# Patient Record
Sex: Male | Born: 1955
Health system: Southern US, Community
[De-identification: ages and names within clinical notes are randomized; demographics above are authoritative.]

## PROBLEM LIST (undated history)

## (undated) DIAGNOSIS — G4733 Obstructive sleep apnea (adult) (pediatric): Secondary | ICD-10-CM

## (undated) DIAGNOSIS — C159 Malignant neoplasm of esophagus, unspecified: Secondary | ICD-10-CM

## (undated) DIAGNOSIS — I10 Essential (primary) hypertension: Secondary | ICD-10-CM

## (undated) DIAGNOSIS — J45909 Unspecified asthma, uncomplicated: Secondary | ICD-10-CM

## (undated) DIAGNOSIS — I2699 Other pulmonary embolism without acute cor pulmonale: Secondary | ICD-10-CM

## (undated) DIAGNOSIS — J449 Chronic obstructive pulmonary disease, unspecified: Secondary | ICD-10-CM

## (undated) DIAGNOSIS — Z9889 Other specified postprocedural states: Secondary | ICD-10-CM

## (undated) DIAGNOSIS — R918 Other nonspecific abnormal finding of lung field: Secondary | ICD-10-CM

## (undated) HISTORY — DX: Chronic obstructive pulmonary disease, unspecified: J44.9

## (undated) HISTORY — DX: Other pulmonary embolism without acute cor pulmonale: I26.99

## (undated) HISTORY — DX: Obstructive sleep apnea (adult) (pediatric): G47.33

## (undated) HISTORY — PX: CERVICAL SPINE SURGERY: SHX589

## (undated) HISTORY — PX: TONSILLECTOMY: SUR1361

## (undated) HISTORY — DX: Malignant neoplasm of esophagus, unspecified: C15.9

## (undated) HISTORY — DX: Other nonspecific abnormal finding of lung field: R91.8

---

## 2011-02-28 HISTORY — PX: OTHER SURGICAL HISTORY: SHX169

## 2011-07-21 ENCOUNTER — Other Ambulatory Visit (HOSPITAL_COMMUNITY): Payer: Self-pay | Admitting: Oncology

## 2011-07-21 DIAGNOSIS — C155 Malignant neoplasm of lower third of esophagus: Secondary | ICD-10-CM

## 2011-07-27 ENCOUNTER — Encounter (HOSPITAL_COMMUNITY)
Admission: RE | Admit: 2011-07-27 | Discharge: 2011-07-27 | Disposition: A | Discharge status: Home / Self Care | Payer: BC Managed Care – PPO | Source: Ambulatory Visit | Attending: Oncology | Admitting: Oncology

## 2011-07-27 ENCOUNTER — Encounter (HOSPITAL_COMMUNITY): Payer: Self-pay

## 2011-07-27 DIAGNOSIS — C155 Malignant neoplasm of lower third of esophagus: Secondary | ICD-10-CM | POA: Insufficient documentation

## 2011-07-27 LAB — GLUCOSE, CAPILLARY: Glucose-Capillary: 94 mg/dL (ref 70–99)

## 2011-07-27 MED ORDER — FLUDEOXYGLUCOSE F - 18 (FDG) INJECTION
19.5000 | Freq: Once | INTRAVENOUS | Status: AC | PRN
  Administered 2011-07-27: 19.5 via INTRAVENOUS

## 2011-07-28 ENCOUNTER — Other Ambulatory Visit (HOSPITAL_COMMUNITY): Payer: Self-pay

## 2011-07-31 ENCOUNTER — Other Ambulatory Visit: Payer: Self-pay

## 2011-07-31 ENCOUNTER — Telehealth: Payer: Self-pay | Admitting: *Deleted

## 2011-07-31 ENCOUNTER — Telehealth: Payer: Self-pay

## 2011-07-31 DIAGNOSIS — C159 Malignant neoplasm of esophagus, unspecified: Secondary | ICD-10-CM

## 2011-07-31 NOTE — Telephone Encounter (Signed)
Received phone call from Dr. Dennie Maizes office and spoke with [facility].  Patient needs referral to Dr. Christella Hartigan for endoscopic ultrasound and is referred for GI conference.  This RN sent information from [facility] and Dr. Silvana Newness office to Chales Abrahams at Dr. Larae Grooms office.  This RN spoke with patient and informed him he would be receiving calls from both Dr. Larae Grooms office and Dr. Dennie Maizes office for appointments.  This RN gave patient contact phone number for questions/concerns.  Patient states he would plan to receive any therapy (chemo or radiation) in [place] where he lives.

## 2011-07-31 NOTE — Telephone Encounter (Signed)
Message copied by Donata Duff on Mon Jul 31, 2011  1:00 PM ------      Message from: Rob Bunting P      Created: Mon Jul 31, 2011 11:49 AM       He needs upper EUS this Thursday, staging for newly diagnosed esophageal cancer (Dr. Charm Barges in [place]).  60 min, radial +/- linear, does not need propofol.            Thanks

## 2011-07-31 NOTE — Telephone Encounter (Signed)
Pt has been scheduled for EUS 08/03/11 Left message on machine to call back

## 2011-08-01 NOTE — Telephone Encounter (Signed)
Pt has been notified and meds reviewed he will call with any questions 

## 2011-08-03 ENCOUNTER — Encounter (HOSPITAL_COMMUNITY): Payer: Self-pay | Admitting: *Deleted

## 2011-08-03 ENCOUNTER — Encounter (HOSPITAL_COMMUNITY): Admission: RE | Disposition: A | Discharge status: Home / Self Care | Payer: Self-pay | Source: Ambulatory Visit

## 2011-08-03 ENCOUNTER — Ambulatory Visit (HOSPITAL_COMMUNITY)
Admission: RE | Admit: 2011-08-03 | Discharge: 2011-08-03 | Disposition: A | Discharge status: Home / Self Care | Payer: BC Managed Care – PPO | Source: Ambulatory Visit | Attending: Gastroenterology | Admitting: Gastroenterology

## 2011-08-03 DIAGNOSIS — C155 Malignant neoplasm of lower third of esophagus: Secondary | ICD-10-CM | POA: Insufficient documentation

## 2011-08-03 DIAGNOSIS — D001 Carcinoma in situ of esophagus: Secondary | ICD-10-CM

## 2011-08-03 DIAGNOSIS — F17200 Nicotine dependence, unspecified, uncomplicated: Secondary | ICD-10-CM | POA: Insufficient documentation

## 2011-08-03 DIAGNOSIS — C159 Malignant neoplasm of esophagus, unspecified: Secondary | ICD-10-CM

## 2011-08-03 DIAGNOSIS — I10 Essential (primary) hypertension: Secondary | ICD-10-CM | POA: Insufficient documentation

## 2011-08-03 HISTORY — DX: Carcinoma in situ of esophagus: D00.1

## 2011-08-03 HISTORY — PX: EUS: SHX5427

## 2011-08-03 HISTORY — DX: Other specified postprocedural states: Z98.890

## 2011-08-03 HISTORY — DX: Essential (primary) hypertension: I10

## 2011-08-03 HISTORY — DX: Unspecified asthma, uncomplicated: J45.909

## 2011-08-03 SURGERY — UPPER ENDOSCOPIC ULTRASOUND (EUS) LINEAR
Anesthesia: Moderate Sedation

## 2011-08-03 MED ORDER — FENTANYL CITRATE 0.05 MG/ML IJ SOLN
INTRAMUSCULAR | Status: AC
  Filled 2011-08-03: qty 2

## 2011-08-03 MED ORDER — MIDAZOLAM HCL 10 MG/2ML IJ SOLN
INTRAMUSCULAR | Status: AC
  Filled 2011-08-03: qty 2

## 2011-08-03 MED ORDER — SODIUM CHLORIDE 0.9 % IV SOLN
Freq: Once | INTRAVENOUS | Status: AC
  Administered 2011-08-03: 500 mL via INTRAVENOUS

## 2011-08-03 MED ORDER — FENTANYL CITRATE 0.05 MG/ML IJ SOLN
INTRAMUSCULAR | Status: DC | PRN
  Administered 2011-08-03 (×3): 25 ug via INTRAVENOUS

## 2011-08-03 MED ORDER — MIDAZOLAM HCL 10 MG/2ML IJ SOLN
INTRAMUSCULAR | Status: DC | PRN
  Administered 2011-08-03 (×4): 2 mg via INTRAVENOUS

## 2011-08-03 NOTE — Op Note (Signed)
[facility] [address] [place], [place]  [address]  ENDOSCOPIC ULTRASOUND PROCEDURE REPORT  PATIENT:  [name], [name]  MR#:  [id number] BIRTHDATE:  19-May-1955  GENDER:  male ENDOSCOPIST:  Rachael Fee, MD REFERRED BY:  Sheliah Plane, M.D. PROCEDURE DATE:  08/03/2011 PROCEDURE:  Upper EUS ASA CLASS:  Class III INDICATIONS:  recently diagnosed esophageal adenocarcinoma (Dr. Charm Barges), here for staging EUS MEDICATIONS:   Fentanyl 75 mcg IV, Versed 8 mg IV  DESCRIPTION OF PROCEDURE:   After the risks benefits and alternatives of the procedure were  explained, informed consent was obtained. The patient was then placed in the left, lateral, decubitus postion and IV sedation was administered. Throughout the procedure, the patient's blood pressure, pulse and oxygen saturations were monitored continuously.  Under direct visualization, the 110036 endoscope was introduced through the mouth and advanced to the second portion of the duodenum.  Water was used as necessary to provide an acoustic interface.  Upon completion of the imaging, water was removed and the patient was sent to the recovery room in satisfactory condition. <<PROCEDUREIMAGES>>  Endoscopic findings: 1. Circumferential, nearly obstructing,  clearly malignant mass in distal esophagus.  Proximal edge at 45cm from incisors, distal edge 50cm from incisors (5cm long tumor).  The GE junction was obliterated by tumor but probably lays at 48cm from incisors (there is tumor in proximal 1-2cm of stomach). 2. Otherwise normal stomach, duodenum  EUS findings: 1. The mass above corresponds with a hypoechoic, irregular, 1.2cm thick mass at GE junction that clearly passes into and through the muscularis propria layer of the esophageal wall (uT3). 2. There were two large, likely malignant lymphnodes at gastrohepatic ligament (2.2cm across). 3. There were 5 smaller, round, suspicious  paraesophageal lymphnodes.  Impression: 5cm long, circumferential, near obstructing uT3N1b eGE junction adenocarcinoma with proxima edge at 45cm from insisors.  ______________________________ Rachael Fee, MD  cc: Dairl Ponder, MD; Webb Silversmith, MD  n. eSIGNED:   Rachael Fee at 08/03/2011 04:37 PM  Leeann Must, [phone]

## 2011-08-03 NOTE — H&P (Signed)
  HPI: This is a man recently found to have esophageal adenocarcinoma by Dr. Charm Barges.  Recent PET suggested malignant adenopathy as well    Past Medical History  Diagnosis Date  . Hx of cervical spine surgery   . Hypertension   . Asthma     hx of asthma as a child    Past Surgical History  Procedure Date  . Cervical spine surgery   . Tonsillectomy     No current facility-administered medications for this encounter.    Allergies as of 07/31/2011  . (No Known Allergies)    No family history on file.  History   Social History  . Marital Status: Married    Spouse Name: N/A    Number of Children: N/A  . Years of Education: N/A   Occupational History  . Not on file.   Social History Main Topics  . Smoking status: Current Everyday Smoker -- 1.0 packs/day    Types: Cigarettes  . Smokeless tobacco: Not on file  . Alcohol Use: No  . Drug Use: Yes    Special: Marijuana  . Sexually Active:    Other Topics Concern  . Not on file   Social History Narrative  . No narrative on file      Physical Exam: BP 120/81  Temp(Src) 98.1 F (36.7 C) (Oral)  Resp 15  Ht 6\' 2"  (1.88 m)  Wt 225 lb (102.059 kg)  BMI 28.89 kg/m2  SpO2 96% Constitutional: generally well-appearing Psychiatric: alert and oriented x3 Abdomen: soft, nontender, nondistended, no obvious ascites, no peritoneal signs, normal bowel sounds     Assessment and plan: 56 y.o. male with esophageal cancer, likely locally advanced  For EUS today for staging

## 2011-08-03 NOTE — Discharge Instructions (Signed)

## 2011-08-04 ENCOUNTER — Encounter (HOSPITAL_COMMUNITY): Payer: Self-pay | Admitting: Gastroenterology

## 2011-10-25 ENCOUNTER — Other Ambulatory Visit (HOSPITAL_COMMUNITY): Payer: Self-pay | Admitting: Oncology

## 2011-10-25 DIAGNOSIS — C159 Malignant neoplasm of esophagus, unspecified: Secondary | ICD-10-CM

## 2011-10-31 ENCOUNTER — Encounter (HOSPITAL_COMMUNITY)
Admission: RE | Admit: 2011-10-31 | Discharge: 2011-10-31 | Disposition: A | Discharge status: Home / Self Care | Payer: Medicaid Other | Source: Ambulatory Visit | Attending: Oncology | Admitting: Oncology

## 2011-10-31 ENCOUNTER — Other Ambulatory Visit (HOSPITAL_COMMUNITY): Payer: BC Managed Care – PPO

## 2011-10-31 DIAGNOSIS — R911 Solitary pulmonary nodule: Secondary | ICD-10-CM | POA: Insufficient documentation

## 2011-10-31 DIAGNOSIS — C159 Malignant neoplasm of esophagus, unspecified: Secondary | ICD-10-CM

## 2011-10-31 LAB — GLUCOSE, CAPILLARY: Glucose-Capillary: 114 mg/dL — ABNORMAL HIGH (ref 70–99)

## 2011-10-31 MED ORDER — FLUDEOXYGLUCOSE F - 18 (FDG) INJECTION
18.2000 | Freq: Once | INTRAVENOUS | Status: AC | PRN
  Administered 2011-10-31: 18.2 via INTRAVENOUS

## 2012-01-16 DIAGNOSIS — J449 Chronic obstructive pulmonary disease, unspecified: Secondary | ICD-10-CM | POA: Insufficient documentation

## 2012-01-16 DIAGNOSIS — I10 Essential (primary) hypertension: Secondary | ICD-10-CM | POA: Insufficient documentation

## 2012-01-17 DIAGNOSIS — D500 Iron deficiency anemia secondary to blood loss (chronic): Secondary | ICD-10-CM | POA: Insufficient documentation

## 2012-01-23 DIAGNOSIS — I2699 Other pulmonary embolism without acute cor pulmonale: Secondary | ICD-10-CM | POA: Insufficient documentation

## 2013-01-21 IMAGING — PT NM PET TUM IMG RESTAG (PS) SKULL BASE T - THIGH
6 series · 25 of 25 positions shown · non-contrast
Comparison: PET CT scan 07/27/2011

CLINICAL DATA: Subsequent treatment strategy for esophageal
carcinoma.. PET CT scan 07/27/2011

NUCLEAR MEDICINE PET SKULL BASE TO THIGH
Fasting Blood Glucose:  114
TECHNIQUE: 18.2 mCi F-18 FDG was injected intravenously. CT data
was obtained and used for attenuation correction and anatomic
localization only.  (This was not acquired as a diagnostic CT
examination.) Additional exam technical data entered on
technologist worksheet.

[Series 1: pet ac · axial · 3.3mm · 4.69mm/px · z∈[-921,-51]mm · 5 of 267 slices shown]
[im 1/267]
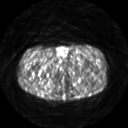
[im 67/267]
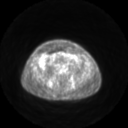
[im 134/267]
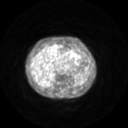
[im 200/267]
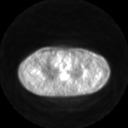
[im 267/267]
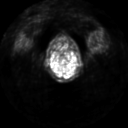

[Series 2: ct images · axial · 3.8mm · 0.98mm/px · z∈[-921,-51]mm · 5 of 264 slices shown]
[im 1/264]
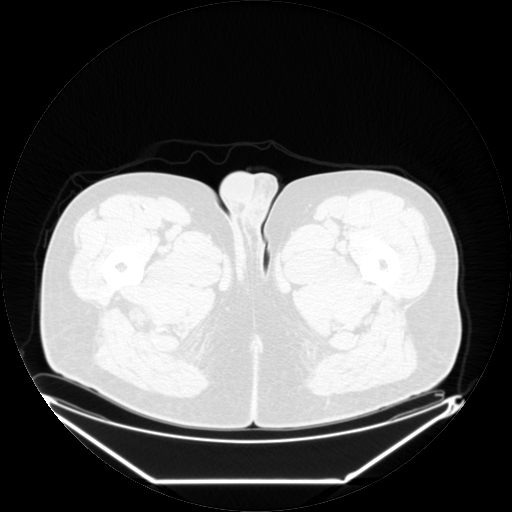
[im 66/264]
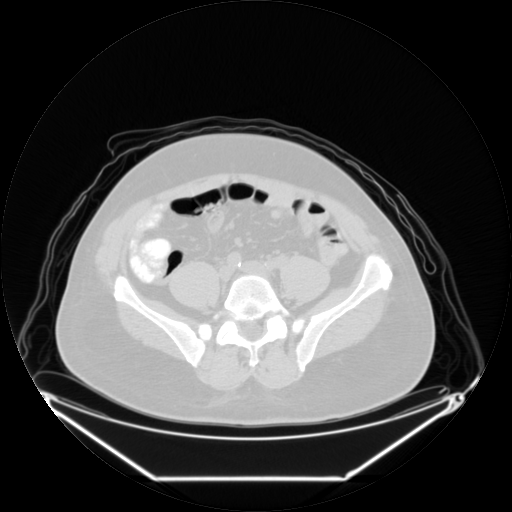
[im 132/264]
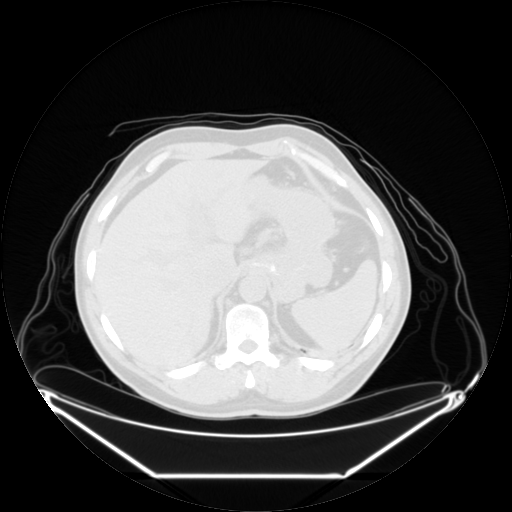
[im 198/264]
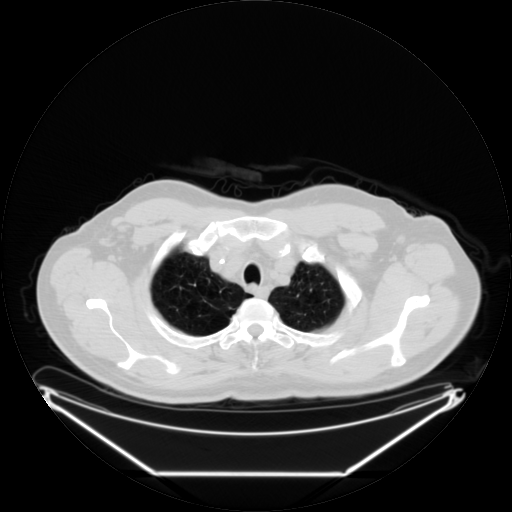
[im 264/264  brain]
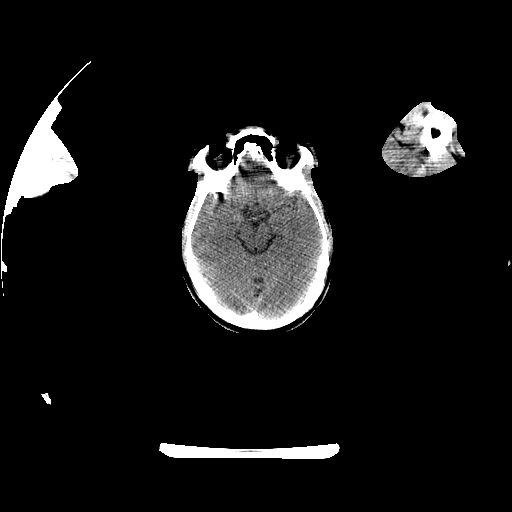

[Series 2: pet nac · axial · 3.3mm · 4.69mm/px · z∈[-921,-51]mm · 6 of 267 slices shown]
[im 1/267]
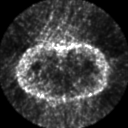
[im 54/267]
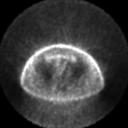
[im 107/267]
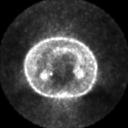
[im 160/267]
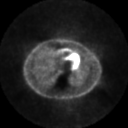
[im 213/267]
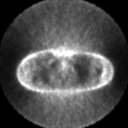
[im 267/267]
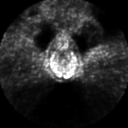

[Series 123: mip · coronal · 3.3mm · 4.69mm/px · 1 of 30 slices shown]
[im 1/30]
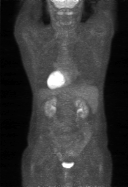

[Series 151: reformatted · axial · 3.3mm · 3.91mm/px · z∈[-921,-51]mm · 6 of 265 slices shown (1 of 2)]
[im 1/265]
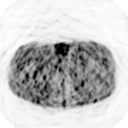
[im 53/265]
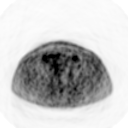
[im 106/265]
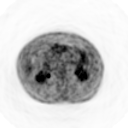
[im 159/265]
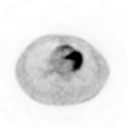
[im 212/265]
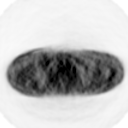
[im 265/265]
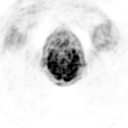

[Series 153: reformatted · coronal · 4.7mm · 6.98mm/px · 2 of 73 slices shown (2 of 2)]
[im 1/73]
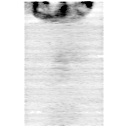
[im 73/73]
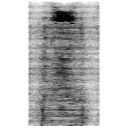

[25 of 25 positions shown; findings below may reference images not displayed]

FINDINGS: Neck: No hypermetabolic lymph nodes in the neck.

Chest:  Small 4 mm right upper lobe pulmonary nodule (image 82) is
not changed from prior.  No hypermetabolic mediastinal lymph nodes.

Abdomen/Pelvis:  Marked reduction in hypermetabolic activity
through the gas esophageal junction with no residual metabolic
activity above background activity.

Likewise the enlarged gastrohepatic ligament lymph nodes seen on
prior exam are reduced in size and have no residual hypermetabolic
activity above background.  Likewise periportal lymph nodes are
reduced in size and have no hypermetabolic activity above
background.  There is a low density lesion along the curvature of
the stomach measuring 3.2 cm which is unchanged from prior has no
hypermetabolic activity is simple fluid attenuation.  This is
unchanged from prior.  No abnormal metabolic activity in the liver.

No hypermetabolic pelvic lymph nodes.

There is a 20 mm high density cyst extending from the left kidney
which is unchanged as no metabolic active.

[place]:  No focal hypermetabolic activity to suggest skeletal
metastasis.  Low metabolic marrow activity through the upper lumbar
spine consistent with radiation port.
IMPRESSION: 1..  Complete resolution of hypermetabolic activity through the GE
junction consistent positive therapy response.

2.  No residual hypermetabolic activity within the  gastrohepatic
lymph nodes or periportal lymph nodes.
3.  No evidence of disease progression or distant metastasis.
4.  Stable small right pulmonary nodule is likely benign.

## 2013-04-10 HISTORY — PX: OTHER SURGICAL HISTORY: SHX169

## 2014-05-20 DIAGNOSIS — I2510 Atherosclerotic heart disease of native coronary artery without angina pectoris: Secondary | ICD-10-CM | POA: Diagnosis not present

## 2014-05-20 DIAGNOSIS — C159 Malignant neoplasm of esophagus, unspecified: Secondary | ICD-10-CM | POA: Diagnosis not present

## 2014-05-20 DIAGNOSIS — I10 Essential (primary) hypertension: Secondary | ICD-10-CM | POA: Diagnosis not present

## 2014-05-20 DIAGNOSIS — Z6821 Body mass index (BMI) 21.0-21.9, adult: Secondary | ICD-10-CM | POA: Diagnosis not present

## 2014-06-17 DIAGNOSIS — J9809 Other diseases of bronchus, not elsewhere classified: Secondary | ICD-10-CM | POA: Diagnosis not present

## 2014-06-17 DIAGNOSIS — C155 Malignant neoplasm of lower third of esophagus: Secondary | ICD-10-CM | POA: Diagnosis not present

## 2014-06-17 DIAGNOSIS — R911 Solitary pulmonary nodule: Secondary | ICD-10-CM | POA: Diagnosis not present

## 2014-06-17 DIAGNOSIS — Z9884 Bariatric surgery status: Secondary | ICD-10-CM | POA: Diagnosis not present

## 2014-06-17 DIAGNOSIS — J439 Emphysema, unspecified: Secondary | ICD-10-CM | POA: Diagnosis not present

## 2014-06-17 DIAGNOSIS — Z8501 Personal history of malignant neoplasm of esophagus: Secondary | ICD-10-CM | POA: Diagnosis not present

## 2014-06-18 DIAGNOSIS — Z7901 Long term (current) use of anticoagulants: Secondary | ICD-10-CM | POA: Diagnosis not present

## 2014-06-18 DIAGNOSIS — C155 Malignant neoplasm of lower third of esophagus: Secondary | ICD-10-CM | POA: Diagnosis not present

## 2014-06-18 DIAGNOSIS — I2699 Other pulmonary embolism without acute cor pulmonale: Secondary | ICD-10-CM | POA: Diagnosis not present

## 2014-06-30 DIAGNOSIS — Z6821 Body mass index (BMI) 21.0-21.9, adult: Secondary | ICD-10-CM | POA: Diagnosis not present

## 2014-06-30 DIAGNOSIS — I82409 Acute embolism and thrombosis of unspecified deep veins of unspecified lower extremity: Secondary | ICD-10-CM | POA: Diagnosis not present

## 2014-07-01 DIAGNOSIS — I82431 Acute embolism and thrombosis of right popliteal vein: Secondary | ICD-10-CM | POA: Diagnosis not present

## 2014-07-01 DIAGNOSIS — I82441 Acute embolism and thrombosis of right tibial vein: Secondary | ICD-10-CM | POA: Diagnosis not present

## 2014-07-01 DIAGNOSIS — I82409 Acute embolism and thrombosis of unspecified deep veins of unspecified lower extremity: Secondary | ICD-10-CM | POA: Diagnosis not present

## 2014-07-08 DIAGNOSIS — Z7901 Long term (current) use of anticoagulants: Secondary | ICD-10-CM | POA: Diagnosis not present

## 2014-07-22 DIAGNOSIS — I82409 Acute embolism and thrombosis of unspecified deep veins of unspecified lower extremity: Secondary | ICD-10-CM | POA: Diagnosis not present

## 2014-07-22 DIAGNOSIS — Z7901 Long term (current) use of anticoagulants: Secondary | ICD-10-CM | POA: Diagnosis not present

## 2014-08-24 DIAGNOSIS — Z7901 Long term (current) use of anticoagulants: Secondary | ICD-10-CM | POA: Diagnosis not present

## 2014-09-10 DIAGNOSIS — Z7901 Long term (current) use of anticoagulants: Secondary | ICD-10-CM | POA: Diagnosis not present

## 2014-09-15 DIAGNOSIS — D709 Neutropenia, unspecified: Secondary | ICD-10-CM | POA: Diagnosis not present

## 2014-09-15 DIAGNOSIS — Z6821 Body mass index (BMI) 21.0-21.9, adult: Secondary | ICD-10-CM | POA: Diagnosis not present

## 2014-09-15 DIAGNOSIS — Z125 Encounter for screening for malignant neoplasm of prostate: Secondary | ICD-10-CM | POA: Diagnosis not present

## 2014-09-15 DIAGNOSIS — Z6822 Body mass index (BMI) 22.0-22.9, adult: Secondary | ICD-10-CM | POA: Diagnosis not present

## 2014-09-15 DIAGNOSIS — I10 Essential (primary) hypertension: Secondary | ICD-10-CM | POA: Diagnosis not present

## 2014-09-15 DIAGNOSIS — Z7901 Long term (current) use of anticoagulants: Secondary | ICD-10-CM | POA: Diagnosis not present

## 2014-10-01 DIAGNOSIS — Z1211 Encounter for screening for malignant neoplasm of colon: Secondary | ICD-10-CM | POA: Diagnosis not present

## 2014-10-01 DIAGNOSIS — Z7901 Long term (current) use of anticoagulants: Secondary | ICD-10-CM | POA: Diagnosis not present

## 2014-10-15 DIAGNOSIS — I2510 Atherosclerotic heart disease of native coronary artery without angina pectoris: Secondary | ICD-10-CM | POA: Diagnosis not present

## 2014-10-15 DIAGNOSIS — E858 Other amyloidosis: Secondary | ICD-10-CM | POA: Diagnosis not present

## 2014-10-15 DIAGNOSIS — C159 Malignant neoplasm of esophagus, unspecified: Secondary | ICD-10-CM | POA: Diagnosis not present

## 2014-10-15 DIAGNOSIS — Z6821 Body mass index (BMI) 21.0-21.9, adult: Secondary | ICD-10-CM | POA: Diagnosis not present

## 2014-10-15 DIAGNOSIS — I10 Essential (primary) hypertension: Secondary | ICD-10-CM | POA: Diagnosis not present

## 2014-10-30 DIAGNOSIS — Z7901 Long term (current) use of anticoagulants: Secondary | ICD-10-CM | POA: Diagnosis not present

## 2014-12-18 DIAGNOSIS — C155 Malignant neoplasm of lower third of esophagus: Secondary | ICD-10-CM | POA: Diagnosis not present

## 2014-12-18 DIAGNOSIS — Z86718 Personal history of other venous thrombosis and embolism: Secondary | ICD-10-CM | POA: Diagnosis not present

## 2014-12-18 DIAGNOSIS — R739 Hyperglycemia, unspecified: Secondary | ICD-10-CM | POA: Diagnosis not present

## 2014-12-18 DIAGNOSIS — Z8501 Personal history of malignant neoplasm of esophagus: Secondary | ICD-10-CM | POA: Diagnosis not present

## 2014-12-31 DIAGNOSIS — C159 Malignant neoplasm of esophagus, unspecified: Secondary | ICD-10-CM | POA: Diagnosis not present

## 2014-12-31 DIAGNOSIS — I2699 Other pulmonary embolism without acute cor pulmonale: Secondary | ICD-10-CM | POA: Diagnosis not present

## 2014-12-31 DIAGNOSIS — Z6822 Body mass index (BMI) 22.0-22.9, adult: Secondary | ICD-10-CM | POA: Diagnosis not present

## 2014-12-31 DIAGNOSIS — R0600 Dyspnea, unspecified: Secondary | ICD-10-CM | POA: Diagnosis not present

## 2015-01-04 DIAGNOSIS — R0602 Shortness of breath: Secondary | ICD-10-CM | POA: Diagnosis not present

## 2015-01-04 DIAGNOSIS — I2699 Other pulmonary embolism without acute cor pulmonale: Secondary | ICD-10-CM | POA: Diagnosis not present

## 2015-01-05 DIAGNOSIS — J449 Chronic obstructive pulmonary disease, unspecified: Secondary | ICD-10-CM | POA: Diagnosis not present

## 2015-01-05 DIAGNOSIS — R5383 Other fatigue: Secondary | ICD-10-CM | POA: Diagnosis not present

## 2015-01-05 DIAGNOSIS — G4733 Obstructive sleep apnea (adult) (pediatric): Secondary | ICD-10-CM | POA: Diagnosis not present

## 2015-01-06 DIAGNOSIS — K219 Gastro-esophageal reflux disease without esophagitis: Secondary | ICD-10-CM | POA: Diagnosis not present

## 2015-01-06 DIAGNOSIS — Z9889 Other specified postprocedural states: Secondary | ICD-10-CM | POA: Diagnosis not present

## 2015-01-06 DIAGNOSIS — I2699 Other pulmonary embolism without acute cor pulmonale: Secondary | ICD-10-CM | POA: Diagnosis not present

## 2015-01-11 DIAGNOSIS — G4733 Obstructive sleep apnea (adult) (pediatric): Secondary | ICD-10-CM | POA: Diagnosis not present

## 2015-01-13 DIAGNOSIS — Z6822 Body mass index (BMI) 22.0-22.9, adult: Secondary | ICD-10-CM | POA: Diagnosis not present

## 2015-01-13 DIAGNOSIS — Z0000 Encounter for general adult medical examination without abnormal findings: Secondary | ICD-10-CM | POA: Diagnosis not present

## 2015-01-19 DIAGNOSIS — I10 Essential (primary) hypertension: Secondary | ICD-10-CM | POA: Diagnosis not present

## 2015-01-19 DIAGNOSIS — J449 Chronic obstructive pulmonary disease, unspecified: Secondary | ICD-10-CM | POA: Diagnosis not present

## 2015-01-19 DIAGNOSIS — C159 Malignant neoplasm of esophagus, unspecified: Secondary | ICD-10-CM | POA: Diagnosis not present

## 2015-01-25 DIAGNOSIS — H6502 Acute serous otitis media, left ear: Secondary | ICD-10-CM | POA: Diagnosis not present

## 2015-01-25 DIAGNOSIS — Z6822 Body mass index (BMI) 22.0-22.9, adult: Secondary | ICD-10-CM | POA: Diagnosis not present

## 2015-02-01 DIAGNOSIS — R5383 Other fatigue: Secondary | ICD-10-CM | POA: Diagnosis not present

## 2015-02-01 DIAGNOSIS — J449 Chronic obstructive pulmonary disease, unspecified: Secondary | ICD-10-CM | POA: Diagnosis not present

## 2015-02-01 DIAGNOSIS — G4733 Obstructive sleep apnea (adult) (pediatric): Secondary | ICD-10-CM | POA: Diagnosis not present

## 2015-02-12 DIAGNOSIS — J449 Chronic obstructive pulmonary disease, unspecified: Secondary | ICD-10-CM | POA: Diagnosis not present

## 2015-02-12 DIAGNOSIS — I10 Essential (primary) hypertension: Secondary | ICD-10-CM | POA: Diagnosis not present

## 2015-02-12 DIAGNOSIS — C159 Malignant neoplasm of esophagus, unspecified: Secondary | ICD-10-CM | POA: Diagnosis not present

## 2015-02-12 DIAGNOSIS — Z6822 Body mass index (BMI) 22.0-22.9, adult: Secondary | ICD-10-CM | POA: Diagnosis not present

## 2015-02-12 DIAGNOSIS — I2510 Atherosclerotic heart disease of native coronary artery without angina pectoris: Secondary | ICD-10-CM | POA: Diagnosis not present

## 2015-02-18 DIAGNOSIS — C159 Malignant neoplasm of esophagus, unspecified: Secondary | ICD-10-CM | POA: Diagnosis not present

## 2015-02-18 DIAGNOSIS — I10 Essential (primary) hypertension: Secondary | ICD-10-CM | POA: Diagnosis not present

## 2015-02-18 DIAGNOSIS — J449 Chronic obstructive pulmonary disease, unspecified: Secondary | ICD-10-CM | POA: Diagnosis not present

## 2015-02-24 DIAGNOSIS — Z6822 Body mass index (BMI) 22.0-22.9, adult: Secondary | ICD-10-CM | POA: Diagnosis not present

## 2015-02-24 DIAGNOSIS — R071 Chest pain on breathing: Secondary | ICD-10-CM | POA: Diagnosis not present

## 2015-02-24 DIAGNOSIS — J90 Pleural effusion, not elsewhere classified: Secondary | ICD-10-CM | POA: Diagnosis not present

## 2015-03-09 DIAGNOSIS — I5189 Other ill-defined heart diseases: Secondary | ICD-10-CM | POA: Diagnosis not present

## 2015-03-09 DIAGNOSIS — R069 Unspecified abnormalities of breathing: Secondary | ICD-10-CM | POA: Diagnosis not present

## 2015-03-09 DIAGNOSIS — J181 Lobar pneumonia, unspecified organism: Secondary | ICD-10-CM | POA: Diagnosis not present

## 2015-03-09 DIAGNOSIS — F17200 Nicotine dependence, unspecified, uncomplicated: Secondary | ICD-10-CM | POA: Diagnosis not present

## 2015-03-09 DIAGNOSIS — I10 Essential (primary) hypertension: Secondary | ICD-10-CM | POA: Diagnosis not present

## 2015-03-09 DIAGNOSIS — I2782 Chronic pulmonary embolism: Secondary | ICD-10-CM | POA: Diagnosis not present

## 2015-03-09 DIAGNOSIS — I471 Supraventricular tachycardia: Secondary | ICD-10-CM | POA: Diagnosis not present

## 2015-03-09 DIAGNOSIS — J441 Chronic obstructive pulmonary disease with (acute) exacerbation: Secondary | ICD-10-CM | POA: Diagnosis not present

## 2015-03-09 DIAGNOSIS — J440 Chronic obstructive pulmonary disease with acute lower respiratory infection: Secondary | ICD-10-CM | POA: Diagnosis not present

## 2015-03-09 DIAGNOSIS — J9601 Acute respiratory failure with hypoxia: Secondary | ICD-10-CM | POA: Diagnosis not present

## 2015-03-09 DIAGNOSIS — R0602 Shortness of breath: Secondary | ICD-10-CM | POA: Diagnosis not present

## 2015-03-09 DIAGNOSIS — R791 Abnormal coagulation profile: Secondary | ICD-10-CM | POA: Diagnosis not present

## 2015-03-16 DIAGNOSIS — C159 Malignant neoplasm of esophagus, unspecified: Secondary | ICD-10-CM | POA: Diagnosis not present

## 2015-03-16 DIAGNOSIS — I10 Essential (primary) hypertension: Secondary | ICD-10-CM | POA: Diagnosis not present

## 2015-03-16 DIAGNOSIS — M1990 Unspecified osteoarthritis, unspecified site: Secondary | ICD-10-CM | POA: Diagnosis not present

## 2015-03-16 DIAGNOSIS — F329 Major depressive disorder, single episode, unspecified: Secondary | ICD-10-CM | POA: Diagnosis not present

## 2015-03-16 DIAGNOSIS — J441 Chronic obstructive pulmonary disease with (acute) exacerbation: Secondary | ICD-10-CM | POA: Diagnosis not present

## 2015-03-16 DIAGNOSIS — K219 Gastro-esophageal reflux disease without esophagitis: Secondary | ICD-10-CM | POA: Diagnosis not present

## 2015-03-16 DIAGNOSIS — J189 Pneumonia, unspecified organism: Secondary | ICD-10-CM | POA: Diagnosis not present

## 2015-03-16 DIAGNOSIS — J9601 Acute respiratory failure with hypoxia: Secondary | ICD-10-CM | POA: Diagnosis not present

## 2015-03-16 DIAGNOSIS — J440 Chronic obstructive pulmonary disease with acute lower respiratory infection: Secondary | ICD-10-CM | POA: Diagnosis not present

## 2015-03-17 DIAGNOSIS — F329 Major depressive disorder, single episode, unspecified: Secondary | ICD-10-CM | POA: Diagnosis not present

## 2015-03-17 DIAGNOSIS — I10 Essential (primary) hypertension: Secondary | ICD-10-CM | POA: Diagnosis not present

## 2015-03-17 DIAGNOSIS — K219 Gastro-esophageal reflux disease without esophagitis: Secondary | ICD-10-CM | POA: Diagnosis not present

## 2015-03-17 DIAGNOSIS — M1990 Unspecified osteoarthritis, unspecified site: Secondary | ICD-10-CM | POA: Diagnosis not present

## 2015-03-17 DIAGNOSIS — J189 Pneumonia, unspecified organism: Secondary | ICD-10-CM | POA: Diagnosis not present

## 2015-03-17 DIAGNOSIS — C159 Malignant neoplasm of esophagus, unspecified: Secondary | ICD-10-CM | POA: Diagnosis not present

## 2015-03-17 DIAGNOSIS — J440 Chronic obstructive pulmonary disease with acute lower respiratory infection: Secondary | ICD-10-CM | POA: Diagnosis not present

## 2015-03-17 DIAGNOSIS — J441 Chronic obstructive pulmonary disease with (acute) exacerbation: Secondary | ICD-10-CM | POA: Diagnosis not present

## 2015-03-17 DIAGNOSIS — J9601 Acute respiratory failure with hypoxia: Secondary | ICD-10-CM | POA: Diagnosis not present

## 2015-03-18 DIAGNOSIS — J441 Chronic obstructive pulmonary disease with (acute) exacerbation: Secondary | ICD-10-CM | POA: Diagnosis not present

## 2015-03-18 DIAGNOSIS — J189 Pneumonia, unspecified organism: Secondary | ICD-10-CM | POA: Diagnosis not present

## 2015-03-18 DIAGNOSIS — M1990 Unspecified osteoarthritis, unspecified site: Secondary | ICD-10-CM | POA: Diagnosis not present

## 2015-03-18 DIAGNOSIS — C159 Malignant neoplasm of esophagus, unspecified: Secondary | ICD-10-CM | POA: Diagnosis not present

## 2015-03-18 DIAGNOSIS — J9601 Acute respiratory failure with hypoxia: Secondary | ICD-10-CM | POA: Diagnosis not present

## 2015-03-18 DIAGNOSIS — K219 Gastro-esophageal reflux disease without esophagitis: Secondary | ICD-10-CM | POA: Diagnosis not present

## 2015-03-18 DIAGNOSIS — J440 Chronic obstructive pulmonary disease with acute lower respiratory infection: Secondary | ICD-10-CM | POA: Diagnosis not present

## 2015-03-18 DIAGNOSIS — I10 Essential (primary) hypertension: Secondary | ICD-10-CM | POA: Diagnosis not present

## 2015-03-18 DIAGNOSIS — F329 Major depressive disorder, single episode, unspecified: Secondary | ICD-10-CM | POA: Diagnosis not present

## 2015-03-19 DIAGNOSIS — F329 Major depressive disorder, single episode, unspecified: Secondary | ICD-10-CM | POA: Diagnosis not present

## 2015-03-19 DIAGNOSIS — J440 Chronic obstructive pulmonary disease with acute lower respiratory infection: Secondary | ICD-10-CM | POA: Diagnosis not present

## 2015-03-19 DIAGNOSIS — J189 Pneumonia, unspecified organism: Secondary | ICD-10-CM | POA: Diagnosis not present

## 2015-03-19 DIAGNOSIS — J441 Chronic obstructive pulmonary disease with (acute) exacerbation: Secondary | ICD-10-CM | POA: Diagnosis not present

## 2015-03-19 DIAGNOSIS — J9601 Acute respiratory failure with hypoxia: Secondary | ICD-10-CM | POA: Diagnosis not present

## 2015-03-19 DIAGNOSIS — K219 Gastro-esophageal reflux disease without esophagitis: Secondary | ICD-10-CM | POA: Diagnosis not present

## 2015-03-19 DIAGNOSIS — I10 Essential (primary) hypertension: Secondary | ICD-10-CM | POA: Diagnosis not present

## 2015-03-19 DIAGNOSIS — M1990 Unspecified osteoarthritis, unspecified site: Secondary | ICD-10-CM | POA: Diagnosis not present

## 2015-03-19 DIAGNOSIS — C159 Malignant neoplasm of esophagus, unspecified: Secondary | ICD-10-CM | POA: Diagnosis not present

## 2015-03-21 DIAGNOSIS — J449 Chronic obstructive pulmonary disease, unspecified: Secondary | ICD-10-CM | POA: Diagnosis not present

## 2015-03-21 DIAGNOSIS — I10 Essential (primary) hypertension: Secondary | ICD-10-CM | POA: Diagnosis not present

## 2015-03-21 DIAGNOSIS — C159 Malignant neoplasm of esophagus, unspecified: Secondary | ICD-10-CM | POA: Diagnosis not present

## 2015-03-22 DIAGNOSIS — I10 Essential (primary) hypertension: Secondary | ICD-10-CM | POA: Diagnosis not present

## 2015-03-22 DIAGNOSIS — F329 Major depressive disorder, single episode, unspecified: Secondary | ICD-10-CM | POA: Diagnosis not present

## 2015-03-22 DIAGNOSIS — Z8501 Personal history of malignant neoplasm of esophagus: Secondary | ICD-10-CM | POA: Diagnosis not present

## 2015-03-22 DIAGNOSIS — C159 Malignant neoplasm of esophagus, unspecified: Secondary | ICD-10-CM | POA: Diagnosis not present

## 2015-03-22 DIAGNOSIS — Z6823 Body mass index (BMI) 23.0-23.9, adult: Secondary | ICD-10-CM | POA: Diagnosis not present

## 2015-03-22 DIAGNOSIS — M1990 Unspecified osteoarthritis, unspecified site: Secondary | ICD-10-CM | POA: Diagnosis not present

## 2015-03-22 DIAGNOSIS — J441 Chronic obstructive pulmonary disease with (acute) exacerbation: Secondary | ICD-10-CM | POA: Diagnosis not present

## 2015-03-22 DIAGNOSIS — J9601 Acute respiratory failure with hypoxia: Secondary | ICD-10-CM | POA: Diagnosis not present

## 2015-03-22 DIAGNOSIS — J440 Chronic obstructive pulmonary disease with acute lower respiratory infection: Secondary | ICD-10-CM | POA: Diagnosis not present

## 2015-03-22 DIAGNOSIS — J189 Pneumonia, unspecified organism: Secondary | ICD-10-CM | POA: Diagnosis not present

## 2015-03-22 DIAGNOSIS — K219 Gastro-esophageal reflux disease without esophagitis: Secondary | ICD-10-CM | POA: Diagnosis not present

## 2015-03-23 DIAGNOSIS — J9601 Acute respiratory failure with hypoxia: Secondary | ICD-10-CM | POA: Diagnosis not present

## 2015-03-23 DIAGNOSIS — C159 Malignant neoplasm of esophagus, unspecified: Secondary | ICD-10-CM | POA: Diagnosis not present

## 2015-03-23 DIAGNOSIS — J440 Chronic obstructive pulmonary disease with acute lower respiratory infection: Secondary | ICD-10-CM | POA: Diagnosis not present

## 2015-03-23 DIAGNOSIS — J189 Pneumonia, unspecified organism: Secondary | ICD-10-CM | POA: Diagnosis not present

## 2015-03-23 DIAGNOSIS — J441 Chronic obstructive pulmonary disease with (acute) exacerbation: Secondary | ICD-10-CM | POA: Diagnosis not present

## 2015-03-23 DIAGNOSIS — I10 Essential (primary) hypertension: Secondary | ICD-10-CM | POA: Diagnosis not present

## 2015-03-23 DIAGNOSIS — M1990 Unspecified osteoarthritis, unspecified site: Secondary | ICD-10-CM | POA: Diagnosis not present

## 2015-03-23 DIAGNOSIS — Z7901 Long term (current) use of anticoagulants: Secondary | ICD-10-CM | POA: Diagnosis not present

## 2015-03-23 DIAGNOSIS — F329 Major depressive disorder, single episode, unspecified: Secondary | ICD-10-CM | POA: Diagnosis not present

## 2015-03-23 DIAGNOSIS — K219 Gastro-esophageal reflux disease without esophagitis: Secondary | ICD-10-CM | POA: Diagnosis not present

## 2015-03-24 DIAGNOSIS — J440 Chronic obstructive pulmonary disease with acute lower respiratory infection: Secondary | ICD-10-CM | POA: Diagnosis not present

## 2015-03-24 DIAGNOSIS — F329 Major depressive disorder, single episode, unspecified: Secondary | ICD-10-CM | POA: Diagnosis not present

## 2015-03-24 DIAGNOSIS — C159 Malignant neoplasm of esophagus, unspecified: Secondary | ICD-10-CM | POA: Diagnosis not present

## 2015-03-24 DIAGNOSIS — J9601 Acute respiratory failure with hypoxia: Secondary | ICD-10-CM | POA: Diagnosis not present

## 2015-03-24 DIAGNOSIS — J441 Chronic obstructive pulmonary disease with (acute) exacerbation: Secondary | ICD-10-CM | POA: Diagnosis not present

## 2015-03-24 DIAGNOSIS — M1990 Unspecified osteoarthritis, unspecified site: Secondary | ICD-10-CM | POA: Diagnosis not present

## 2015-03-24 DIAGNOSIS — I10 Essential (primary) hypertension: Secondary | ICD-10-CM | POA: Diagnosis not present

## 2015-03-24 DIAGNOSIS — J189 Pneumonia, unspecified organism: Secondary | ICD-10-CM | POA: Diagnosis not present

## 2015-03-24 DIAGNOSIS — K219 Gastro-esophageal reflux disease without esophagitis: Secondary | ICD-10-CM | POA: Diagnosis not present

## 2015-03-25 DIAGNOSIS — C159 Malignant neoplasm of esophagus, unspecified: Secondary | ICD-10-CM | POA: Diagnosis not present

## 2015-03-25 DIAGNOSIS — F329 Major depressive disorder, single episode, unspecified: Secondary | ICD-10-CM | POA: Diagnosis not present

## 2015-03-25 DIAGNOSIS — M1990 Unspecified osteoarthritis, unspecified site: Secondary | ICD-10-CM | POA: Diagnosis not present

## 2015-03-25 DIAGNOSIS — J441 Chronic obstructive pulmonary disease with (acute) exacerbation: Secondary | ICD-10-CM | POA: Diagnosis not present

## 2015-03-25 DIAGNOSIS — K219 Gastro-esophageal reflux disease without esophagitis: Secondary | ICD-10-CM | POA: Diagnosis not present

## 2015-03-25 DIAGNOSIS — I10 Essential (primary) hypertension: Secondary | ICD-10-CM | POA: Diagnosis not present

## 2015-03-25 DIAGNOSIS — J440 Chronic obstructive pulmonary disease with acute lower respiratory infection: Secondary | ICD-10-CM | POA: Diagnosis not present

## 2015-03-25 DIAGNOSIS — J9601 Acute respiratory failure with hypoxia: Secondary | ICD-10-CM | POA: Diagnosis not present

## 2015-03-25 DIAGNOSIS — J189 Pneumonia, unspecified organism: Secondary | ICD-10-CM | POA: Diagnosis not present

## 2015-03-29 DIAGNOSIS — J9601 Acute respiratory failure with hypoxia: Secondary | ICD-10-CM | POA: Diagnosis not present

## 2015-03-29 DIAGNOSIS — M1990 Unspecified osteoarthritis, unspecified site: Secondary | ICD-10-CM | POA: Diagnosis not present

## 2015-03-29 DIAGNOSIS — K219 Gastro-esophageal reflux disease without esophagitis: Secondary | ICD-10-CM | POA: Diagnosis not present

## 2015-03-29 DIAGNOSIS — J440 Chronic obstructive pulmonary disease with acute lower respiratory infection: Secondary | ICD-10-CM | POA: Diagnosis not present

## 2015-03-29 DIAGNOSIS — J189 Pneumonia, unspecified organism: Secondary | ICD-10-CM | POA: Diagnosis not present

## 2015-03-29 DIAGNOSIS — J441 Chronic obstructive pulmonary disease with (acute) exacerbation: Secondary | ICD-10-CM | POA: Diagnosis not present

## 2015-03-29 DIAGNOSIS — I10 Essential (primary) hypertension: Secondary | ICD-10-CM | POA: Diagnosis not present

## 2015-03-29 DIAGNOSIS — C159 Malignant neoplasm of esophagus, unspecified: Secondary | ICD-10-CM | POA: Diagnosis not present

## 2015-03-29 DIAGNOSIS — F329 Major depressive disorder, single episode, unspecified: Secondary | ICD-10-CM | POA: Diagnosis not present

## 2015-03-30 DIAGNOSIS — J440 Chronic obstructive pulmonary disease with acute lower respiratory infection: Secondary | ICD-10-CM | POA: Diagnosis not present

## 2015-03-30 DIAGNOSIS — I10 Essential (primary) hypertension: Secondary | ICD-10-CM | POA: Diagnosis not present

## 2015-03-30 DIAGNOSIS — J441 Chronic obstructive pulmonary disease with (acute) exacerbation: Secondary | ICD-10-CM | POA: Diagnosis not present

## 2015-03-30 DIAGNOSIS — K219 Gastro-esophageal reflux disease without esophagitis: Secondary | ICD-10-CM | POA: Diagnosis not present

## 2015-03-30 DIAGNOSIS — C159 Malignant neoplasm of esophagus, unspecified: Secondary | ICD-10-CM | POA: Diagnosis not present

## 2015-03-30 DIAGNOSIS — F329 Major depressive disorder, single episode, unspecified: Secondary | ICD-10-CM | POA: Diagnosis not present

## 2015-03-30 DIAGNOSIS — M1990 Unspecified osteoarthritis, unspecified site: Secondary | ICD-10-CM | POA: Diagnosis not present

## 2015-03-30 DIAGNOSIS — J189 Pneumonia, unspecified organism: Secondary | ICD-10-CM | POA: Diagnosis not present

## 2015-03-30 DIAGNOSIS — J9601 Acute respiratory failure with hypoxia: Secondary | ICD-10-CM | POA: Diagnosis not present

## 2015-04-02 DIAGNOSIS — C159 Malignant neoplasm of esophagus, unspecified: Secondary | ICD-10-CM | POA: Diagnosis not present

## 2015-04-02 DIAGNOSIS — I10 Essential (primary) hypertension: Secondary | ICD-10-CM | POA: Diagnosis not present

## 2015-04-02 DIAGNOSIS — J9601 Acute respiratory failure with hypoxia: Secondary | ICD-10-CM | POA: Diagnosis not present

## 2015-04-02 DIAGNOSIS — K219 Gastro-esophageal reflux disease without esophagitis: Secondary | ICD-10-CM | POA: Diagnosis not present

## 2015-04-02 DIAGNOSIS — M1990 Unspecified osteoarthritis, unspecified site: Secondary | ICD-10-CM | POA: Diagnosis not present

## 2015-04-02 DIAGNOSIS — F329 Major depressive disorder, single episode, unspecified: Secondary | ICD-10-CM | POA: Diagnosis not present

## 2015-04-02 DIAGNOSIS — J441 Chronic obstructive pulmonary disease with (acute) exacerbation: Secondary | ICD-10-CM | POA: Diagnosis not present

## 2015-04-02 DIAGNOSIS — J440 Chronic obstructive pulmonary disease with acute lower respiratory infection: Secondary | ICD-10-CM | POA: Diagnosis not present

## 2015-04-02 DIAGNOSIS — J189 Pneumonia, unspecified organism: Secondary | ICD-10-CM | POA: Diagnosis not present

## 2015-04-03 DIAGNOSIS — J441 Chronic obstructive pulmonary disease with (acute) exacerbation: Secondary | ICD-10-CM | POA: Diagnosis not present

## 2015-04-03 DIAGNOSIS — J9601 Acute respiratory failure with hypoxia: Secondary | ICD-10-CM | POA: Diagnosis not present

## 2015-04-03 DIAGNOSIS — I10 Essential (primary) hypertension: Secondary | ICD-10-CM | POA: Diagnosis not present

## 2015-04-03 DIAGNOSIS — C159 Malignant neoplasm of esophagus, unspecified: Secondary | ICD-10-CM | POA: Diagnosis not present

## 2015-04-03 DIAGNOSIS — J189 Pneumonia, unspecified organism: Secondary | ICD-10-CM | POA: Diagnosis not present

## 2015-04-03 DIAGNOSIS — K219 Gastro-esophageal reflux disease without esophagitis: Secondary | ICD-10-CM | POA: Diagnosis not present

## 2015-04-03 DIAGNOSIS — F329 Major depressive disorder, single episode, unspecified: Secondary | ICD-10-CM | POA: Diagnosis not present

## 2015-04-03 DIAGNOSIS — J440 Chronic obstructive pulmonary disease with acute lower respiratory infection: Secondary | ICD-10-CM | POA: Diagnosis not present

## 2015-04-03 DIAGNOSIS — M1990 Unspecified osteoarthritis, unspecified site: Secondary | ICD-10-CM | POA: Diagnosis not present

## 2015-04-06 DIAGNOSIS — C159 Malignant neoplasm of esophagus, unspecified: Secondary | ICD-10-CM | POA: Diagnosis not present

## 2015-04-06 DIAGNOSIS — J9601 Acute respiratory failure with hypoxia: Secondary | ICD-10-CM | POA: Diagnosis not present

## 2015-04-06 DIAGNOSIS — J441 Chronic obstructive pulmonary disease with (acute) exacerbation: Secondary | ICD-10-CM | POA: Diagnosis not present

## 2015-04-06 DIAGNOSIS — M1990 Unspecified osteoarthritis, unspecified site: Secondary | ICD-10-CM | POA: Diagnosis not present

## 2015-04-06 DIAGNOSIS — I4891 Unspecified atrial fibrillation: Secondary | ICD-10-CM | POA: Diagnosis not present

## 2015-04-06 DIAGNOSIS — J189 Pneumonia, unspecified organism: Secondary | ICD-10-CM | POA: Diagnosis not present

## 2015-04-06 DIAGNOSIS — J440 Chronic obstructive pulmonary disease with acute lower respiratory infection: Secondary | ICD-10-CM | POA: Diagnosis not present

## 2015-04-06 DIAGNOSIS — K219 Gastro-esophageal reflux disease without esophagitis: Secondary | ICD-10-CM | POA: Diagnosis not present

## 2015-04-06 DIAGNOSIS — F329 Major depressive disorder, single episode, unspecified: Secondary | ICD-10-CM | POA: Diagnosis not present

## 2015-04-06 DIAGNOSIS — I10 Essential (primary) hypertension: Secondary | ICD-10-CM | POA: Diagnosis not present

## 2015-04-07 DIAGNOSIS — J189 Pneumonia, unspecified organism: Secondary | ICD-10-CM | POA: Diagnosis not present

## 2015-04-07 DIAGNOSIS — F329 Major depressive disorder, single episode, unspecified: Secondary | ICD-10-CM | POA: Diagnosis not present

## 2015-04-07 DIAGNOSIS — I10 Essential (primary) hypertension: Secondary | ICD-10-CM | POA: Diagnosis not present

## 2015-04-07 DIAGNOSIS — J9601 Acute respiratory failure with hypoxia: Secondary | ICD-10-CM | POA: Diagnosis not present

## 2015-04-07 DIAGNOSIS — M1990 Unspecified osteoarthritis, unspecified site: Secondary | ICD-10-CM | POA: Diagnosis not present

## 2015-04-07 DIAGNOSIS — J440 Chronic obstructive pulmonary disease with acute lower respiratory infection: Secondary | ICD-10-CM | POA: Diagnosis not present

## 2015-04-07 DIAGNOSIS — J441 Chronic obstructive pulmonary disease with (acute) exacerbation: Secondary | ICD-10-CM | POA: Diagnosis not present

## 2015-04-07 DIAGNOSIS — C159 Malignant neoplasm of esophagus, unspecified: Secondary | ICD-10-CM | POA: Diagnosis not present

## 2015-04-07 DIAGNOSIS — K219 Gastro-esophageal reflux disease without esophagitis: Secondary | ICD-10-CM | POA: Diagnosis not present

## 2015-04-09 DIAGNOSIS — J440 Chronic obstructive pulmonary disease with acute lower respiratory infection: Secondary | ICD-10-CM | POA: Diagnosis not present

## 2015-04-09 DIAGNOSIS — J9601 Acute respiratory failure with hypoxia: Secondary | ICD-10-CM | POA: Diagnosis not present

## 2015-04-09 DIAGNOSIS — J189 Pneumonia, unspecified organism: Secondary | ICD-10-CM | POA: Diagnosis not present

## 2015-04-09 DIAGNOSIS — J441 Chronic obstructive pulmonary disease with (acute) exacerbation: Secondary | ICD-10-CM | POA: Diagnosis not present

## 2015-04-20 DIAGNOSIS — R0902 Hypoxemia: Secondary | ICD-10-CM | POA: Diagnosis not present

## 2015-04-20 DIAGNOSIS — Z6823 Body mass index (BMI) 23.0-23.9, adult: Secondary | ICD-10-CM | POA: Diagnosis not present

## 2015-04-20 DIAGNOSIS — J449 Chronic obstructive pulmonary disease, unspecified: Secondary | ICD-10-CM | POA: Diagnosis not present

## 2015-04-20 DIAGNOSIS — C159 Malignant neoplasm of esophagus, unspecified: Secondary | ICD-10-CM | POA: Diagnosis not present

## 2015-04-21 DIAGNOSIS — I10 Essential (primary) hypertension: Secondary | ICD-10-CM | POA: Diagnosis not present

## 2015-04-21 DIAGNOSIS — J449 Chronic obstructive pulmonary disease, unspecified: Secondary | ICD-10-CM | POA: Diagnosis not present

## 2015-04-21 DIAGNOSIS — C159 Malignant neoplasm of esophagus, unspecified: Secondary | ICD-10-CM | POA: Diagnosis not present

## 2015-04-23 DIAGNOSIS — I2699 Other pulmonary embolism without acute cor pulmonale: Secondary | ICD-10-CM | POA: Diagnosis not present

## 2015-04-23 DIAGNOSIS — R0602 Shortness of breath: Secondary | ICD-10-CM | POA: Diagnosis not present

## 2015-04-23 DIAGNOSIS — R918 Other nonspecific abnormal finding of lung field: Secondary | ICD-10-CM | POA: Diagnosis not present

## 2015-04-23 DIAGNOSIS — J441 Chronic obstructive pulmonary disease with (acute) exacerbation: Secondary | ICD-10-CM | POA: Diagnosis not present

## 2015-04-23 DIAGNOSIS — Z6823 Body mass index (BMI) 23.0-23.9, adult: Secondary | ICD-10-CM | POA: Diagnosis not present

## 2015-04-23 DIAGNOSIS — J90 Pleural effusion, not elsewhere classified: Secondary | ICD-10-CM | POA: Diagnosis not present

## 2015-04-30 DIAGNOSIS — Z6823 Body mass index (BMI) 23.0-23.9, adult: Secondary | ICD-10-CM | POA: Diagnosis not present

## 2015-04-30 DIAGNOSIS — R0781 Pleurodynia: Secondary | ICD-10-CM | POA: Diagnosis not present

## 2015-04-30 DIAGNOSIS — Z7901 Long term (current) use of anticoagulants: Secondary | ICD-10-CM | POA: Diagnosis not present

## 2015-04-30 DIAGNOSIS — Z5181 Encounter for therapeutic drug level monitoring: Secondary | ICD-10-CM | POA: Diagnosis not present

## 2015-05-10 DIAGNOSIS — I2699 Other pulmonary embolism without acute cor pulmonale: Secondary | ICD-10-CM | POA: Diagnosis not present

## 2015-05-10 DIAGNOSIS — J449 Chronic obstructive pulmonary disease, unspecified: Secondary | ICD-10-CM | POA: Diagnosis not present

## 2015-05-10 DIAGNOSIS — J439 Emphysema, unspecified: Secondary | ICD-10-CM | POA: Diagnosis not present

## 2015-05-10 DIAGNOSIS — I2510 Atherosclerotic heart disease of native coronary artery without angina pectoris: Secondary | ICD-10-CM | POA: Diagnosis not present

## 2015-05-19 DIAGNOSIS — J449 Chronic obstructive pulmonary disease, unspecified: Secondary | ICD-10-CM | POA: Diagnosis not present

## 2015-05-19 DIAGNOSIS — C159 Malignant neoplasm of esophagus, unspecified: Secondary | ICD-10-CM | POA: Diagnosis not present

## 2015-05-19 DIAGNOSIS — I10 Essential (primary) hypertension: Secondary | ICD-10-CM | POA: Diagnosis not present

## 2015-06-03 DIAGNOSIS — Z7901 Long term (current) use of anticoagulants: Secondary | ICD-10-CM | POA: Diagnosis not present

## 2015-06-09 DIAGNOSIS — Z6823 Body mass index (BMI) 23.0-23.9, adult: Secondary | ICD-10-CM | POA: Diagnosis not present

## 2015-06-09 DIAGNOSIS — I809 Phlebitis and thrombophlebitis of unspecified site: Secondary | ICD-10-CM | POA: Diagnosis not present

## 2015-06-09 DIAGNOSIS — Z7901 Long term (current) use of anticoagulants: Secondary | ICD-10-CM | POA: Diagnosis not present

## 2015-06-09 DIAGNOSIS — M25571 Pain in right ankle and joints of right foot: Secondary | ICD-10-CM | POA: Diagnosis not present

## 2015-06-10 DIAGNOSIS — J439 Emphysema, unspecified: Secondary | ICD-10-CM | POA: Diagnosis not present

## 2015-06-10 DIAGNOSIS — I2699 Other pulmonary embolism without acute cor pulmonale: Secondary | ICD-10-CM | POA: Diagnosis not present

## 2015-06-10 DIAGNOSIS — J449 Chronic obstructive pulmonary disease, unspecified: Secondary | ICD-10-CM | POA: Diagnosis not present

## 2015-06-10 DIAGNOSIS — I2510 Atherosclerotic heart disease of native coronary artery without angina pectoris: Secondary | ICD-10-CM | POA: Diagnosis not present

## 2015-06-14 DIAGNOSIS — R5383 Other fatigue: Secondary | ICD-10-CM | POA: Diagnosis not present

## 2015-06-14 DIAGNOSIS — J449 Chronic obstructive pulmonary disease, unspecified: Secondary | ICD-10-CM | POA: Diagnosis not present

## 2015-06-14 DIAGNOSIS — G4733 Obstructive sleep apnea (adult) (pediatric): Secondary | ICD-10-CM | POA: Diagnosis not present

## 2015-06-16 DIAGNOSIS — M25579 Pain in unspecified ankle and joints of unspecified foot: Secondary | ICD-10-CM | POA: Diagnosis not present

## 2015-06-16 DIAGNOSIS — I82409 Acute embolism and thrombosis of unspecified deep veins of unspecified lower extremity: Secondary | ICD-10-CM | POA: Diagnosis not present

## 2015-06-18 DIAGNOSIS — I700 Atherosclerosis of aorta: Secondary | ICD-10-CM | POA: Diagnosis not present

## 2015-06-18 DIAGNOSIS — C159 Malignant neoplasm of esophagus, unspecified: Secondary | ICD-10-CM | POA: Diagnosis not present

## 2015-06-18 DIAGNOSIS — R911 Solitary pulmonary nodule: Secondary | ICD-10-CM | POA: Diagnosis not present

## 2015-06-18 DIAGNOSIS — C155 Malignant neoplasm of lower third of esophagus: Secondary | ICD-10-CM | POA: Diagnosis not present

## 2015-06-18 DIAGNOSIS — Z7901 Long term (current) use of anticoagulants: Secondary | ICD-10-CM | POA: Diagnosis not present

## 2015-06-18 DIAGNOSIS — J439 Emphysema, unspecified: Secondary | ICD-10-CM | POA: Diagnosis not present

## 2015-06-18 DIAGNOSIS — N281 Cyst of kidney, acquired: Secondary | ICD-10-CM | POA: Diagnosis not present

## 2015-06-18 DIAGNOSIS — I2699 Other pulmonary embolism without acute cor pulmonale: Secondary | ICD-10-CM | POA: Diagnosis not present

## 2015-06-19 DIAGNOSIS — C159 Malignant neoplasm of esophagus, unspecified: Secondary | ICD-10-CM | POA: Diagnosis not present

## 2015-06-19 DIAGNOSIS — I10 Essential (primary) hypertension: Secondary | ICD-10-CM | POA: Diagnosis not present

## 2015-06-19 DIAGNOSIS — J449 Chronic obstructive pulmonary disease, unspecified: Secondary | ICD-10-CM | POA: Diagnosis not present

## 2015-06-21 DIAGNOSIS — Z8501 Personal history of malignant neoplasm of esophagus: Secondary | ICD-10-CM | POA: Diagnosis not present

## 2015-06-24 DIAGNOSIS — Z7901 Long term (current) use of anticoagulants: Secondary | ICD-10-CM | POA: Diagnosis not present

## 2015-07-10 DIAGNOSIS — J449 Chronic obstructive pulmonary disease, unspecified: Secondary | ICD-10-CM | POA: Diagnosis not present

## 2015-07-16 DIAGNOSIS — Z7901 Long term (current) use of anticoagulants: Secondary | ICD-10-CM | POA: Diagnosis not present

## 2015-07-19 DIAGNOSIS — C159 Malignant neoplasm of esophagus, unspecified: Secondary | ICD-10-CM | POA: Diagnosis not present

## 2015-07-19 DIAGNOSIS — J449 Chronic obstructive pulmonary disease, unspecified: Secondary | ICD-10-CM | POA: Diagnosis not present

## 2015-07-19 DIAGNOSIS — I10 Essential (primary) hypertension: Secondary | ICD-10-CM | POA: Diagnosis not present

## 2015-08-03 DIAGNOSIS — Z7901 Long term (current) use of anticoagulants: Secondary | ICD-10-CM | POA: Diagnosis not present

## 2015-08-10 DIAGNOSIS — J449 Chronic obstructive pulmonary disease, unspecified: Secondary | ICD-10-CM | POA: Diagnosis not present

## 2015-08-19 DIAGNOSIS — J449 Chronic obstructive pulmonary disease, unspecified: Secondary | ICD-10-CM | POA: Diagnosis not present

## 2015-08-19 DIAGNOSIS — I10 Essential (primary) hypertension: Secondary | ICD-10-CM | POA: Diagnosis not present

## 2015-08-19 DIAGNOSIS — C159 Malignant neoplasm of esophagus, unspecified: Secondary | ICD-10-CM | POA: Diagnosis not present

## 2015-09-01 DIAGNOSIS — Z6822 Body mass index (BMI) 22.0-22.9, adult: Secondary | ICD-10-CM | POA: Diagnosis not present

## 2015-09-01 DIAGNOSIS — I2510 Atherosclerotic heart disease of native coronary artery without angina pectoris: Secondary | ICD-10-CM | POA: Diagnosis not present

## 2015-09-01 DIAGNOSIS — I10 Essential (primary) hypertension: Secondary | ICD-10-CM | POA: Diagnosis not present

## 2015-09-01 DIAGNOSIS — J449 Chronic obstructive pulmonary disease, unspecified: Secondary | ICD-10-CM | POA: Diagnosis not present

## 2015-09-01 DIAGNOSIS — Z5181 Encounter for therapeutic drug level monitoring: Secondary | ICD-10-CM | POA: Diagnosis not present

## 2015-09-01 DIAGNOSIS — Z8501 Personal history of malignant neoplasm of esophagus: Secondary | ICD-10-CM | POA: Diagnosis not present

## 2015-09-02 DIAGNOSIS — I10 Essential (primary) hypertension: Secondary | ICD-10-CM | POA: Diagnosis not present

## 2015-09-02 DIAGNOSIS — I2699 Other pulmonary embolism without acute cor pulmonale: Secondary | ICD-10-CM | POA: Diagnosis not present

## 2015-09-02 DIAGNOSIS — I2510 Atherosclerotic heart disease of native coronary artery without angina pectoris: Secondary | ICD-10-CM | POA: Diagnosis not present

## 2015-09-02 DIAGNOSIS — Z7901 Long term (current) use of anticoagulants: Secondary | ICD-10-CM | POA: Diagnosis not present

## 2015-09-09 DIAGNOSIS — J449 Chronic obstructive pulmonary disease, unspecified: Secondary | ICD-10-CM | POA: Diagnosis not present

## 2015-09-14 DIAGNOSIS — R5383 Other fatigue: Secondary | ICD-10-CM | POA: Diagnosis not present

## 2015-09-14 DIAGNOSIS — J449 Chronic obstructive pulmonary disease, unspecified: Secondary | ICD-10-CM | POA: Diagnosis not present

## 2015-09-18 DIAGNOSIS — C159 Malignant neoplasm of esophagus, unspecified: Secondary | ICD-10-CM | POA: Diagnosis not present

## 2015-09-18 DIAGNOSIS — I10 Essential (primary) hypertension: Secondary | ICD-10-CM | POA: Diagnosis not present

## 2015-09-18 DIAGNOSIS — J449 Chronic obstructive pulmonary disease, unspecified: Secondary | ICD-10-CM | POA: Diagnosis not present

## 2015-09-23 DIAGNOSIS — J449 Chronic obstructive pulmonary disease, unspecified: Secondary | ICD-10-CM | POA: Diagnosis not present

## 2015-09-23 DIAGNOSIS — J9611 Chronic respiratory failure with hypoxia: Secondary | ICD-10-CM | POA: Diagnosis not present

## 2015-10-04 DIAGNOSIS — Z7901 Long term (current) use of anticoagulants: Secondary | ICD-10-CM | POA: Diagnosis not present

## 2015-10-10 DIAGNOSIS — J449 Chronic obstructive pulmonary disease, unspecified: Secondary | ICD-10-CM | POA: Diagnosis not present

## 2015-10-19 DIAGNOSIS — I10 Essential (primary) hypertension: Secondary | ICD-10-CM | POA: Diagnosis not present

## 2015-10-19 DIAGNOSIS — C159 Malignant neoplasm of esophagus, unspecified: Secondary | ICD-10-CM | POA: Diagnosis not present

## 2015-10-19 DIAGNOSIS — J449 Chronic obstructive pulmonary disease, unspecified: Secondary | ICD-10-CM | POA: Diagnosis not present

## 2015-10-24 DIAGNOSIS — J9611 Chronic respiratory failure with hypoxia: Secondary | ICD-10-CM | POA: Diagnosis not present

## 2015-10-24 DIAGNOSIS — J449 Chronic obstructive pulmonary disease, unspecified: Secondary | ICD-10-CM | POA: Diagnosis not present

## 2015-10-29 DIAGNOSIS — I6200 Nontraumatic subdural hemorrhage, unspecified: Secondary | ICD-10-CM | POA: Diagnosis not present

## 2015-10-29 DIAGNOSIS — R279 Unspecified lack of coordination: Secondary | ICD-10-CM | POA: Diagnosis not present

## 2015-10-29 DIAGNOSIS — I10 Essential (primary) hypertension: Secondary | ICD-10-CM | POA: Diagnosis not present

## 2015-10-29 DIAGNOSIS — I6203 Nontraumatic chronic subdural hemorrhage: Secondary | ICD-10-CM | POA: Diagnosis not present

## 2015-10-29 DIAGNOSIS — I82401 Acute embolism and thrombosis of unspecified deep veins of right lower extremity: Secondary | ICD-10-CM | POA: Diagnosis not present

## 2015-10-29 DIAGNOSIS — I82502 Chronic embolism and thrombosis of unspecified deep veins of left lower extremity: Secondary | ICD-10-CM | POA: Diagnosis not present

## 2015-10-29 DIAGNOSIS — Z743 Need for continuous supervision: Secondary | ICD-10-CM | POA: Diagnosis not present

## 2015-10-29 DIAGNOSIS — I879 Disorder of vein, unspecified: Secondary | ICD-10-CM | POA: Diagnosis not present

## 2015-10-29 DIAGNOSIS — M5030 Other cervical disc degeneration, unspecified cervical region: Secondary | ICD-10-CM | POA: Diagnosis not present

## 2015-10-29 DIAGNOSIS — I6201 Nontraumatic acute subdural hemorrhage: Secondary | ICD-10-CM | POA: Diagnosis not present

## 2015-10-29 DIAGNOSIS — I82501 Chronic embolism and thrombosis of unspecified deep veins of right lower extremity: Secondary | ICD-10-CM | POA: Diagnosis not present

## 2015-10-29 DIAGNOSIS — R51 Headache: Secondary | ICD-10-CM | POA: Diagnosis not present

## 2015-10-29 DIAGNOSIS — J019 Acute sinusitis, unspecified: Secondary | ICD-10-CM | POA: Diagnosis not present

## 2015-10-29 DIAGNOSIS — Z9981 Dependence on supplemental oxygen: Secondary | ICD-10-CM | POA: Diagnosis not present

## 2015-10-29 DIAGNOSIS — Z7901 Long term (current) use of anticoagulants: Secondary | ICD-10-CM | POA: Diagnosis not present

## 2015-10-29 DIAGNOSIS — S065X9A Traumatic subdural hemorrhage with loss of consciousness of unspecified duration, initial encounter: Secondary | ICD-10-CM | POA: Diagnosis not present

## 2015-10-29 DIAGNOSIS — I82541 Chronic embolism and thrombosis of right tibial vein: Secondary | ICD-10-CM | POA: Diagnosis not present

## 2015-10-29 DIAGNOSIS — Z8501 Personal history of malignant neoplasm of esophagus: Secondary | ICD-10-CM | POA: Diagnosis not present

## 2015-10-29 DIAGNOSIS — Z86718 Personal history of other venous thrombosis and embolism: Secondary | ICD-10-CM | POA: Diagnosis not present

## 2015-10-29 DIAGNOSIS — J449 Chronic obstructive pulmonary disease, unspecified: Secondary | ICD-10-CM | POA: Diagnosis not present

## 2015-10-30 DIAGNOSIS — I879 Disorder of vein, unspecified: Secondary | ICD-10-CM | POA: Diagnosis not present

## 2015-10-30 DIAGNOSIS — I82541 Chronic embolism and thrombosis of right tibial vein: Secondary | ICD-10-CM | POA: Diagnosis not present

## 2015-10-31 DIAGNOSIS — I6200 Nontraumatic subdural hemorrhage, unspecified: Secondary | ICD-10-CM | POA: Insufficient documentation

## 2015-11-04 DIAGNOSIS — I6200 Nontraumatic subdural hemorrhage, unspecified: Secondary | ICD-10-CM | POA: Diagnosis not present

## 2015-11-04 DIAGNOSIS — R51 Headache: Secondary | ICD-10-CM | POA: Diagnosis not present

## 2015-11-09 DIAGNOSIS — I6200 Nontraumatic subdural hemorrhage, unspecified: Secondary | ICD-10-CM | POA: Diagnosis not present

## 2015-11-09 DIAGNOSIS — R51 Headache: Secondary | ICD-10-CM | POA: Diagnosis not present

## 2015-11-10 DIAGNOSIS — J449 Chronic obstructive pulmonary disease, unspecified: Secondary | ICD-10-CM | POA: Diagnosis not present

## 2015-11-18 DIAGNOSIS — I6200 Nontraumatic subdural hemorrhage, unspecified: Secondary | ICD-10-CM | POA: Diagnosis not present

## 2015-11-19 DIAGNOSIS — C159 Malignant neoplasm of esophagus, unspecified: Secondary | ICD-10-CM | POA: Diagnosis not present

## 2015-11-19 DIAGNOSIS — I10 Essential (primary) hypertension: Secondary | ICD-10-CM | POA: Diagnosis not present

## 2015-11-19 DIAGNOSIS — J449 Chronic obstructive pulmonary disease, unspecified: Secondary | ICD-10-CM | POA: Diagnosis not present

## 2015-11-24 DIAGNOSIS — J9611 Chronic respiratory failure with hypoxia: Secondary | ICD-10-CM | POA: Diagnosis not present

## 2015-11-24 DIAGNOSIS — J449 Chronic obstructive pulmonary disease, unspecified: Secondary | ICD-10-CM | POA: Diagnosis not present

## 2015-11-25 DIAGNOSIS — Z86718 Personal history of other venous thrombosis and embolism: Secondary | ICD-10-CM | POA: Diagnosis not present

## 2015-11-25 DIAGNOSIS — Z7901 Long term (current) use of anticoagulants: Secondary | ICD-10-CM | POA: Diagnosis not present

## 2015-11-25 DIAGNOSIS — I6203 Nontraumatic chronic subdural hemorrhage: Secondary | ICD-10-CM | POA: Diagnosis not present

## 2015-11-25 DIAGNOSIS — Z9981 Dependence on supplemental oxygen: Secondary | ICD-10-CM | POA: Diagnosis not present

## 2015-11-25 DIAGNOSIS — R51 Headache: Secondary | ICD-10-CM | POA: Diagnosis not present

## 2015-11-25 DIAGNOSIS — J449 Chronic obstructive pulmonary disease, unspecified: Secondary | ICD-10-CM | POA: Diagnosis not present

## 2015-11-25 DIAGNOSIS — I6200 Nontraumatic subdural hemorrhage, unspecified: Secondary | ICD-10-CM | POA: Diagnosis not present

## 2015-12-02 DIAGNOSIS — Z86718 Personal history of other venous thrombosis and embolism: Secondary | ICD-10-CM | POA: Diagnosis not present

## 2015-12-02 DIAGNOSIS — I6203 Nontraumatic chronic subdural hemorrhage: Secondary | ICD-10-CM | POA: Diagnosis not present

## 2015-12-02 DIAGNOSIS — Z7901 Long term (current) use of anticoagulants: Secondary | ICD-10-CM | POA: Diagnosis not present

## 2015-12-10 DIAGNOSIS — J449 Chronic obstructive pulmonary disease, unspecified: Secondary | ICD-10-CM | POA: Diagnosis not present

## 2015-12-14 DIAGNOSIS — J449 Chronic obstructive pulmonary disease, unspecified: Secondary | ICD-10-CM | POA: Diagnosis not present

## 2015-12-14 DIAGNOSIS — G4733 Obstructive sleep apnea (adult) (pediatric): Secondary | ICD-10-CM | POA: Diagnosis not present

## 2015-12-14 DIAGNOSIS — R5383 Other fatigue: Secondary | ICD-10-CM | POA: Diagnosis not present

## 2015-12-19 DIAGNOSIS — I10 Essential (primary) hypertension: Secondary | ICD-10-CM | POA: Diagnosis not present

## 2015-12-19 DIAGNOSIS — C159 Malignant neoplasm of esophagus, unspecified: Secondary | ICD-10-CM | POA: Diagnosis not present

## 2015-12-19 DIAGNOSIS — J449 Chronic obstructive pulmonary disease, unspecified: Secondary | ICD-10-CM | POA: Diagnosis not present

## 2015-12-21 DIAGNOSIS — Z8501 Personal history of malignant neoplasm of esophagus: Secondary | ICD-10-CM | POA: Diagnosis not present

## 2015-12-21 DIAGNOSIS — C155 Malignant neoplasm of lower third of esophagus: Secondary | ICD-10-CM | POA: Diagnosis not present

## 2015-12-24 DIAGNOSIS — J449 Chronic obstructive pulmonary disease, unspecified: Secondary | ICD-10-CM | POA: Diagnosis not present

## 2015-12-24 DIAGNOSIS — J9611 Chronic respiratory failure with hypoxia: Secondary | ICD-10-CM | POA: Diagnosis not present

## 2015-12-27 DIAGNOSIS — G4733 Obstructive sleep apnea (adult) (pediatric): Secondary | ICD-10-CM | POA: Diagnosis not present

## 2016-01-05 DIAGNOSIS — J449 Chronic obstructive pulmonary disease, unspecified: Secondary | ICD-10-CM | POA: Diagnosis not present

## 2016-01-05 DIAGNOSIS — R5383 Other fatigue: Secondary | ICD-10-CM | POA: Diagnosis not present

## 2016-01-05 DIAGNOSIS — Z23 Encounter for immunization: Secondary | ICD-10-CM | POA: Diagnosis not present

## 2016-01-05 DIAGNOSIS — G4733 Obstructive sleep apnea (adult) (pediatric): Secondary | ICD-10-CM | POA: Diagnosis not present

## 2016-01-10 DIAGNOSIS — J449 Chronic obstructive pulmonary disease, unspecified: Secondary | ICD-10-CM | POA: Diagnosis not present

## 2016-01-17 DIAGNOSIS — E782 Mixed hyperlipidemia: Secondary | ICD-10-CM | POA: Diagnosis not present

## 2016-01-17 DIAGNOSIS — J441 Chronic obstructive pulmonary disease with (acute) exacerbation: Secondary | ICD-10-CM | POA: Diagnosis not present

## 2016-01-17 DIAGNOSIS — F329 Major depressive disorder, single episode, unspecified: Secondary | ICD-10-CM | POA: Diagnosis not present

## 2016-01-17 DIAGNOSIS — I10 Essential (primary) hypertension: Secondary | ICD-10-CM | POA: Diagnosis not present

## 2016-01-17 DIAGNOSIS — I6200 Nontraumatic subdural hemorrhage, unspecified: Secondary | ICD-10-CM | POA: Diagnosis not present

## 2016-01-17 DIAGNOSIS — Z8501 Personal history of malignant neoplasm of esophagus: Secondary | ICD-10-CM | POA: Diagnosis not present

## 2016-01-17 DIAGNOSIS — I2510 Atherosclerotic heart disease of native coronary artery without angina pectoris: Secondary | ICD-10-CM | POA: Diagnosis not present

## 2016-01-19 DIAGNOSIS — I10 Essential (primary) hypertension: Secondary | ICD-10-CM | POA: Diagnosis not present

## 2016-01-19 DIAGNOSIS — J449 Chronic obstructive pulmonary disease, unspecified: Secondary | ICD-10-CM | POA: Diagnosis not present

## 2016-01-19 DIAGNOSIS — C159 Malignant neoplasm of esophagus, unspecified: Secondary | ICD-10-CM | POA: Diagnosis not present

## 2016-01-24 DIAGNOSIS — J449 Chronic obstructive pulmonary disease, unspecified: Secondary | ICD-10-CM | POA: Diagnosis not present

## 2016-01-24 DIAGNOSIS — J9611 Chronic respiratory failure with hypoxia: Secondary | ICD-10-CM | POA: Diagnosis not present

## 2016-02-09 DIAGNOSIS — J449 Chronic obstructive pulmonary disease, unspecified: Secondary | ICD-10-CM | POA: Diagnosis not present

## 2016-02-17 DIAGNOSIS — J441 Chronic obstructive pulmonary disease with (acute) exacerbation: Secondary | ICD-10-CM | POA: Diagnosis not present

## 2016-02-17 DIAGNOSIS — R5383 Other fatigue: Secondary | ICD-10-CM | POA: Diagnosis not present

## 2016-02-17 DIAGNOSIS — G4733 Obstructive sleep apnea (adult) (pediatric): Secondary | ICD-10-CM | POA: Diagnosis not present

## 2016-02-18 DIAGNOSIS — C159 Malignant neoplasm of esophagus, unspecified: Secondary | ICD-10-CM | POA: Diagnosis not present

## 2016-02-18 DIAGNOSIS — J449 Chronic obstructive pulmonary disease, unspecified: Secondary | ICD-10-CM | POA: Diagnosis not present

## 2016-02-18 DIAGNOSIS — I10 Essential (primary) hypertension: Secondary | ICD-10-CM | POA: Diagnosis not present

## 2016-02-23 DIAGNOSIS — J9611 Chronic respiratory failure with hypoxia: Secondary | ICD-10-CM | POA: Diagnosis not present

## 2016-02-23 DIAGNOSIS — J449 Chronic obstructive pulmonary disease, unspecified: Secondary | ICD-10-CM | POA: Diagnosis not present

## 2016-03-07 DIAGNOSIS — J189 Pneumonia, unspecified organism: Secondary | ICD-10-CM | POA: Diagnosis not present

## 2016-03-07 DIAGNOSIS — J449 Chronic obstructive pulmonary disease, unspecified: Secondary | ICD-10-CM | POA: Diagnosis not present

## 2016-03-07 DIAGNOSIS — G4733 Obstructive sleep apnea (adult) (pediatric): Secondary | ICD-10-CM | POA: Diagnosis not present

## 2016-03-07 DIAGNOSIS — R5383 Other fatigue: Secondary | ICD-10-CM | POA: Diagnosis not present

## 2016-03-11 DIAGNOSIS — J449 Chronic obstructive pulmonary disease, unspecified: Secondary | ICD-10-CM | POA: Diagnosis not present

## 2016-03-14 DIAGNOSIS — J439 Emphysema, unspecified: Secondary | ICD-10-CM | POA: Diagnosis not present

## 2016-03-20 DIAGNOSIS — C159 Malignant neoplasm of esophagus, unspecified: Secondary | ICD-10-CM | POA: Diagnosis not present

## 2016-03-20 DIAGNOSIS — J449 Chronic obstructive pulmonary disease, unspecified: Secondary | ICD-10-CM | POA: Diagnosis not present

## 2016-03-20 DIAGNOSIS — I10 Essential (primary) hypertension: Secondary | ICD-10-CM | POA: Diagnosis not present

## 2016-03-21 DIAGNOSIS — I80291 Phlebitis and thrombophlebitis of other deep vessels of right lower extremity: Secondary | ICD-10-CM | POA: Diagnosis not present

## 2016-03-21 DIAGNOSIS — R0600 Dyspnea, unspecified: Secondary | ICD-10-CM | POA: Diagnosis not present

## 2016-03-21 DIAGNOSIS — G4733 Obstructive sleep apnea (adult) (pediatric): Secondary | ICD-10-CM | POA: Diagnosis not present

## 2016-03-21 DIAGNOSIS — J449 Chronic obstructive pulmonary disease, unspecified: Secondary | ICD-10-CM | POA: Diagnosis not present

## 2016-03-21 DIAGNOSIS — R5383 Other fatigue: Secondary | ICD-10-CM | POA: Diagnosis not present

## 2016-03-22 DIAGNOSIS — I2699 Other pulmonary embolism without acute cor pulmonale: Secondary | ICD-10-CM | POA: Diagnosis not present

## 2016-03-22 DIAGNOSIS — J432 Centrilobular emphysema: Secondary | ICD-10-CM | POA: Diagnosis not present

## 2016-03-22 DIAGNOSIS — J439 Emphysema, unspecified: Secondary | ICD-10-CM | POA: Diagnosis not present

## 2016-03-22 DIAGNOSIS — R071 Chest pain on breathing: Secondary | ICD-10-CM | POA: Diagnosis not present

## 2016-03-22 DIAGNOSIS — Z86711 Personal history of pulmonary embolism: Secondary | ICD-10-CM | POA: Diagnosis not present

## 2016-03-25 DIAGNOSIS — J449 Chronic obstructive pulmonary disease, unspecified: Secondary | ICD-10-CM | POA: Diagnosis not present

## 2016-03-25 DIAGNOSIS — J9611 Chronic respiratory failure with hypoxia: Secondary | ICD-10-CM | POA: Diagnosis not present

## 2016-04-03 DIAGNOSIS — I2699 Other pulmonary embolism without acute cor pulmonale: Secondary | ICD-10-CM | POA: Diagnosis not present

## 2016-04-03 HISTORY — PX: IVC FILTER PLACEMENT (ARMC HX): HXRAD1551

## 2016-04-11 DIAGNOSIS — J449 Chronic obstructive pulmonary disease, unspecified: Secondary | ICD-10-CM | POA: Diagnosis not present

## 2016-04-19 DIAGNOSIS — J441 Chronic obstructive pulmonary disease with (acute) exacerbation: Secondary | ICD-10-CM | POA: Diagnosis not present

## 2016-04-19 DIAGNOSIS — J069 Acute upper respiratory infection, unspecified: Secondary | ICD-10-CM | POA: Diagnosis not present

## 2016-04-20 DIAGNOSIS — C159 Malignant neoplasm of esophagus, unspecified: Secondary | ICD-10-CM | POA: Diagnosis not present

## 2016-04-20 DIAGNOSIS — J449 Chronic obstructive pulmonary disease, unspecified: Secondary | ICD-10-CM | POA: Diagnosis not present

## 2016-04-20 DIAGNOSIS — I10 Essential (primary) hypertension: Secondary | ICD-10-CM | POA: Diagnosis not present

## 2016-04-25 DIAGNOSIS — J449 Chronic obstructive pulmonary disease, unspecified: Secondary | ICD-10-CM | POA: Diagnosis not present

## 2016-04-25 DIAGNOSIS — J9611 Chronic respiratory failure with hypoxia: Secondary | ICD-10-CM | POA: Diagnosis not present

## 2016-05-02 DIAGNOSIS — G4733 Obstructive sleep apnea (adult) (pediatric): Secondary | ICD-10-CM | POA: Diagnosis not present

## 2016-05-02 DIAGNOSIS — J449 Chronic obstructive pulmonary disease, unspecified: Secondary | ICD-10-CM | POA: Diagnosis not present

## 2016-05-02 DIAGNOSIS — R5383 Other fatigue: Secondary | ICD-10-CM | POA: Diagnosis not present

## 2016-05-09 DIAGNOSIS — J449 Chronic obstructive pulmonary disease, unspecified: Secondary | ICD-10-CM | POA: Diagnosis not present

## 2016-05-18 DIAGNOSIS — J449 Chronic obstructive pulmonary disease, unspecified: Secondary | ICD-10-CM | POA: Diagnosis not present

## 2016-05-18 DIAGNOSIS — I10 Essential (primary) hypertension: Secondary | ICD-10-CM | POA: Diagnosis not present

## 2016-05-18 DIAGNOSIS — C159 Malignant neoplasm of esophagus, unspecified: Secondary | ICD-10-CM | POA: Diagnosis not present

## 2016-05-19 DIAGNOSIS — J441 Chronic obstructive pulmonary disease with (acute) exacerbation: Secondary | ICD-10-CM | POA: Diagnosis not present

## 2016-05-23 DIAGNOSIS — J449 Chronic obstructive pulmonary disease, unspecified: Secondary | ICD-10-CM | POA: Diagnosis not present

## 2016-05-23 DIAGNOSIS — J9611 Chronic respiratory failure with hypoxia: Secondary | ICD-10-CM | POA: Diagnosis not present

## 2016-05-30 DIAGNOSIS — G4733 Obstructive sleep apnea (adult) (pediatric): Secondary | ICD-10-CM | POA: Diagnosis not present

## 2016-06-09 DIAGNOSIS — J449 Chronic obstructive pulmonary disease, unspecified: Secondary | ICD-10-CM | POA: Diagnosis not present

## 2016-06-12 DIAGNOSIS — I10 Essential (primary) hypertension: Secondary | ICD-10-CM | POA: Diagnosis not present

## 2016-06-12 DIAGNOSIS — Z6821 Body mass index (BMI) 21.0-21.9, adult: Secondary | ICD-10-CM | POA: Diagnosis not present

## 2016-06-12 DIAGNOSIS — J9610 Chronic respiratory failure, unspecified whether with hypoxia or hypercapnia: Secondary | ICD-10-CM | POA: Diagnosis not present

## 2016-06-12 DIAGNOSIS — D001 Carcinoma in situ of esophagus: Secondary | ICD-10-CM | POA: Diagnosis not present

## 2016-06-12 DIAGNOSIS — R6881 Early satiety: Secondary | ICD-10-CM | POA: Diagnosis not present

## 2016-06-12 DIAGNOSIS — I208 Other forms of angina pectoris: Secondary | ICD-10-CM | POA: Diagnosis not present

## 2016-06-12 DIAGNOSIS — Z0000 Encounter for general adult medical examination without abnormal findings: Secondary | ICD-10-CM | POA: Diagnosis not present

## 2016-06-12 DIAGNOSIS — J449 Chronic obstructive pulmonary disease, unspecified: Secondary | ICD-10-CM | POA: Diagnosis not present

## 2016-06-16 DIAGNOSIS — J449 Chronic obstructive pulmonary disease, unspecified: Secondary | ICD-10-CM | POA: Diagnosis not present

## 2016-06-18 DIAGNOSIS — J449 Chronic obstructive pulmonary disease, unspecified: Secondary | ICD-10-CM | POA: Diagnosis not present

## 2016-06-18 DIAGNOSIS — I10 Essential (primary) hypertension: Secondary | ICD-10-CM | POA: Diagnosis not present

## 2016-06-18 DIAGNOSIS — C159 Malignant neoplasm of esophagus, unspecified: Secondary | ICD-10-CM | POA: Diagnosis not present

## 2016-06-20 DIAGNOSIS — R630 Anorexia: Secondary | ICD-10-CM | POA: Diagnosis not present

## 2016-06-20 DIAGNOSIS — Z8501 Personal history of malignant neoplasm of esophagus: Secondary | ICD-10-CM | POA: Diagnosis not present

## 2016-06-20 DIAGNOSIS — R1310 Dysphagia, unspecified: Secondary | ICD-10-CM | POA: Diagnosis not present

## 2016-06-20 DIAGNOSIS — D649 Anemia, unspecified: Secondary | ICD-10-CM | POA: Diagnosis not present

## 2016-06-20 DIAGNOSIS — R634 Abnormal weight loss: Secondary | ICD-10-CM | POA: Diagnosis not present

## 2016-06-20 DIAGNOSIS — C155 Malignant neoplasm of lower third of esophagus: Secondary | ICD-10-CM | POA: Diagnosis not present

## 2016-06-23 DIAGNOSIS — J449 Chronic obstructive pulmonary disease, unspecified: Secondary | ICD-10-CM | POA: Diagnosis not present

## 2016-06-23 DIAGNOSIS — J9611 Chronic respiratory failure with hypoxia: Secondary | ICD-10-CM | POA: Diagnosis not present

## 2016-06-30 DIAGNOSIS — R5383 Other fatigue: Secondary | ICD-10-CM | POA: Diagnosis not present

## 2016-06-30 DIAGNOSIS — J441 Chronic obstructive pulmonary disease with (acute) exacerbation: Secondary | ICD-10-CM | POA: Diagnosis not present

## 2016-06-30 DIAGNOSIS — G4733 Obstructive sleep apnea (adult) (pediatric): Secondary | ICD-10-CM | POA: Diagnosis not present

## 2016-07-09 DIAGNOSIS — J449 Chronic obstructive pulmonary disease, unspecified: Secondary | ICD-10-CM | POA: Diagnosis not present

## 2016-07-18 DIAGNOSIS — J449 Chronic obstructive pulmonary disease, unspecified: Secondary | ICD-10-CM | POA: Diagnosis not present

## 2016-07-18 DIAGNOSIS — C159 Malignant neoplasm of esophagus, unspecified: Secondary | ICD-10-CM | POA: Diagnosis not present

## 2016-07-18 DIAGNOSIS — I10 Essential (primary) hypertension: Secondary | ICD-10-CM | POA: Diagnosis not present

## 2016-07-19 DIAGNOSIS — G4733 Obstructive sleep apnea (adult) (pediatric): Secondary | ICD-10-CM | POA: Diagnosis not present

## 2016-07-23 DIAGNOSIS — J449 Chronic obstructive pulmonary disease, unspecified: Secondary | ICD-10-CM | POA: Diagnosis not present

## 2016-07-23 DIAGNOSIS — J9611 Chronic respiratory failure with hypoxia: Secondary | ICD-10-CM | POA: Diagnosis not present

## 2016-08-09 DIAGNOSIS — J449 Chronic obstructive pulmonary disease, unspecified: Secondary | ICD-10-CM | POA: Diagnosis not present

## 2016-08-11 DIAGNOSIS — I83811 Varicose veins of right lower extremities with pain: Secondary | ICD-10-CM | POA: Diagnosis not present

## 2016-08-11 DIAGNOSIS — G4733 Obstructive sleep apnea (adult) (pediatric): Secondary | ICD-10-CM | POA: Diagnosis not present

## 2016-08-11 DIAGNOSIS — J441 Chronic obstructive pulmonary disease with (acute) exacerbation: Secondary | ICD-10-CM | POA: Diagnosis not present

## 2016-08-11 DIAGNOSIS — R5383 Other fatigue: Secondary | ICD-10-CM | POA: Diagnosis not present

## 2016-08-11 DIAGNOSIS — R609 Edema, unspecified: Secondary | ICD-10-CM | POA: Diagnosis not present

## 2016-08-18 DIAGNOSIS — J449 Chronic obstructive pulmonary disease, unspecified: Secondary | ICD-10-CM | POA: Diagnosis not present

## 2016-08-18 DIAGNOSIS — I10 Essential (primary) hypertension: Secondary | ICD-10-CM | POA: Diagnosis not present

## 2016-08-18 DIAGNOSIS — C159 Malignant neoplasm of esophagus, unspecified: Secondary | ICD-10-CM | POA: Diagnosis not present

## 2016-08-23 DIAGNOSIS — J9611 Chronic respiratory failure with hypoxia: Secondary | ICD-10-CM | POA: Diagnosis not present

## 2016-08-23 DIAGNOSIS — J449 Chronic obstructive pulmonary disease, unspecified: Secondary | ICD-10-CM | POA: Diagnosis not present

## 2016-09-08 DIAGNOSIS — J449 Chronic obstructive pulmonary disease, unspecified: Secondary | ICD-10-CM | POA: Diagnosis not present

## 2016-09-17 DIAGNOSIS — I10 Essential (primary) hypertension: Secondary | ICD-10-CM | POA: Diagnosis not present

## 2016-09-17 DIAGNOSIS — J449 Chronic obstructive pulmonary disease, unspecified: Secondary | ICD-10-CM | POA: Diagnosis not present

## 2016-09-17 DIAGNOSIS — C159 Malignant neoplasm of esophagus, unspecified: Secondary | ICD-10-CM | POA: Diagnosis not present

## 2016-09-22 DIAGNOSIS — J449 Chronic obstructive pulmonary disease, unspecified: Secondary | ICD-10-CM | POA: Diagnosis not present

## 2016-09-22 DIAGNOSIS — J9611 Chronic respiratory failure with hypoxia: Secondary | ICD-10-CM | POA: Diagnosis not present

## 2016-10-09 DIAGNOSIS — J449 Chronic obstructive pulmonary disease, unspecified: Secondary | ICD-10-CM | POA: Diagnosis not present

## 2016-10-18 DIAGNOSIS — I10 Essential (primary) hypertension: Secondary | ICD-10-CM | POA: Diagnosis not present

## 2016-10-18 DIAGNOSIS — J449 Chronic obstructive pulmonary disease, unspecified: Secondary | ICD-10-CM | POA: Diagnosis not present

## 2016-10-18 DIAGNOSIS — C159 Malignant neoplasm of esophagus, unspecified: Secondary | ICD-10-CM | POA: Diagnosis not present

## 2016-10-19 NOTE — Addendum Note (Signed)
Addended by: Jiles Harold on: 10/19/2016 08:59 AM   Modules accepted: Miquel Dunn

## 2016-10-23 DIAGNOSIS — J9611 Chronic respiratory failure with hypoxia: Secondary | ICD-10-CM | POA: Diagnosis not present

## 2016-10-23 DIAGNOSIS — J449 Chronic obstructive pulmonary disease, unspecified: Secondary | ICD-10-CM | POA: Diagnosis not present

## 2016-10-24 ENCOUNTER — Other Ambulatory Visit: Payer: Self-pay | Admitting: *Deleted

## 2016-10-24 NOTE — Patient Outreach (Signed)
[facility]) Care Management  10/24/2016  [name] [name]. Jul 20, 1955 [id number]  EMMI-engagement referral:  Telephone call to patient; left message with person answering call to have patent return call. Contact information given.  Plan: Will follow up.  Sherrin Daisy, RN BSN [place] Management Coordinator [facility] Care Management  [phone]

## 2016-10-27 ENCOUNTER — Encounter: Payer: Self-pay | Admitting: *Deleted

## 2016-10-27 ENCOUNTER — Other Ambulatory Visit: Payer: Self-pay | Admitting: *Deleted

## 2016-10-27 NOTE — Patient Outreach (Addendum)
[place] [facility]) Care Management  10/27/2016  [name] [name] August 28, 1955 [id number]  EMMI Engagement referral; patient has [facility] Medicare insurance:  Telephone call to patient who  was advised of reason for call & [facility] care management services.  HIPPA verification received from patient. Patient agreed to screening assessment.  Voices that he attends regularly scheduled appointments with his primary care provider, cancer specialist & lung specialist. States he drives self to appointments. Voices that he uses local pharmacy & has no problem obtaining his medications. States he takes medicines as prescribed. Voices that health conditions are currently under control & he had good working knowledge of managing his conditions. States he has follow up CT scan soon & if clear he will be cancer free 5 years of esophageal cancer.(hx chem, radiation & surgery). States he stopped smoking 03/05/2015 after 40 yr habit)..  Voices he does not need case management services or disease management coaching at this time. He consents to received [facility] contact information.  Plan: Close case/send [facility] contact information to patient. Send to care management assistant.  Sherrin Daisy, RN BSN [place] Management Coordinator [facility] Care Management  [phone]

## 2016-11-01 DIAGNOSIS — Z8501 Personal history of malignant neoplasm of esophagus: Secondary | ICD-10-CM | POA: Diagnosis not present

## 2016-11-01 DIAGNOSIS — I10 Essential (primary) hypertension: Secondary | ICD-10-CM | POA: Diagnosis not present

## 2016-11-01 DIAGNOSIS — J441 Chronic obstructive pulmonary disease with (acute) exacerbation: Secondary | ICD-10-CM | POA: Diagnosis not present

## 2016-11-09 DIAGNOSIS — J449 Chronic obstructive pulmonary disease, unspecified: Secondary | ICD-10-CM | POA: Diagnosis not present

## 2016-11-18 DIAGNOSIS — J449 Chronic obstructive pulmonary disease, unspecified: Secondary | ICD-10-CM | POA: Diagnosis not present

## 2016-11-18 DIAGNOSIS — I10 Essential (primary) hypertension: Secondary | ICD-10-CM | POA: Diagnosis not present

## 2016-11-18 DIAGNOSIS — C159 Malignant neoplasm of esophagus, unspecified: Secondary | ICD-10-CM | POA: Diagnosis not present

## 2016-11-23 DIAGNOSIS — J9611 Chronic respiratory failure with hypoxia: Secondary | ICD-10-CM | POA: Diagnosis not present

## 2016-11-23 DIAGNOSIS — J449 Chronic obstructive pulmonary disease, unspecified: Secondary | ICD-10-CM | POA: Diagnosis not present

## 2016-11-29 DIAGNOSIS — J9610 Chronic respiratory failure, unspecified whether with hypoxia or hypercapnia: Secondary | ICD-10-CM | POA: Diagnosis not present

## 2016-11-29 DIAGNOSIS — J449 Chronic obstructive pulmonary disease, unspecified: Secondary | ICD-10-CM | POA: Diagnosis not present

## 2016-12-07 DIAGNOSIS — J441 Chronic obstructive pulmonary disease with (acute) exacerbation: Secondary | ICD-10-CM | POA: Diagnosis not present

## 2016-12-08 DIAGNOSIS — Z95828 Presence of other vascular implants and grafts: Secondary | ICD-10-CM | POA: Diagnosis not present

## 2016-12-08 DIAGNOSIS — J42 Unspecified chronic bronchitis: Secondary | ICD-10-CM | POA: Diagnosis not present

## 2016-12-08 DIAGNOSIS — Z8501 Personal history of malignant neoplasm of esophagus: Secondary | ICD-10-CM | POA: Diagnosis not present

## 2016-12-08 DIAGNOSIS — Z923 Personal history of irradiation: Secondary | ICD-10-CM | POA: Diagnosis not present

## 2016-12-08 DIAGNOSIS — Z9221 Personal history of antineoplastic chemotherapy: Secondary | ICD-10-CM | POA: Diagnosis not present

## 2016-12-08 DIAGNOSIS — I10 Essential (primary) hypertension: Secondary | ICD-10-CM | POA: Diagnosis not present

## 2016-12-08 DIAGNOSIS — E611 Iron deficiency: Secondary | ICD-10-CM | POA: Diagnosis not present

## 2016-12-08 DIAGNOSIS — Z809 Family history of malignant neoplasm, unspecified: Secondary | ICD-10-CM | POA: Diagnosis not present

## 2016-12-08 DIAGNOSIS — Z87891 Personal history of nicotine dependence: Secondary | ICD-10-CM | POA: Diagnosis not present

## 2016-12-09 DIAGNOSIS — J449 Chronic obstructive pulmonary disease, unspecified: Secondary | ICD-10-CM | POA: Diagnosis not present

## 2016-12-14 DIAGNOSIS — J441 Chronic obstructive pulmonary disease with (acute) exacerbation: Secondary | ICD-10-CM | POA: Diagnosis not present

## 2016-12-18 DIAGNOSIS — I10 Essential (primary) hypertension: Secondary | ICD-10-CM | POA: Diagnosis not present

## 2016-12-18 DIAGNOSIS — J449 Chronic obstructive pulmonary disease, unspecified: Secondary | ICD-10-CM | POA: Diagnosis not present

## 2016-12-18 DIAGNOSIS — C159 Malignant neoplasm of esophagus, unspecified: Secondary | ICD-10-CM | POA: Diagnosis not present

## 2016-12-23 DIAGNOSIS — J9611 Chronic respiratory failure with hypoxia: Secondary | ICD-10-CM | POA: Diagnosis not present

## 2016-12-23 DIAGNOSIS — J449 Chronic obstructive pulmonary disease, unspecified: Secondary | ICD-10-CM | POA: Diagnosis not present

## 2017-01-03 DIAGNOSIS — Z8589 Personal history of malignant neoplasm of other organs and systems: Secondary | ICD-10-CM | POA: Diagnosis not present

## 2017-01-03 DIAGNOSIS — Z8509 Personal history of malignant neoplasm of other digestive organs: Secondary | ICD-10-CM | POA: Diagnosis not present

## 2017-01-08 DIAGNOSIS — J441 Chronic obstructive pulmonary disease with (acute) exacerbation: Secondary | ICD-10-CM | POA: Diagnosis not present

## 2017-01-08 DIAGNOSIS — J9611 Chronic respiratory failure with hypoxia: Secondary | ICD-10-CM | POA: Diagnosis not present

## 2017-01-09 DIAGNOSIS — J449 Chronic obstructive pulmonary disease, unspecified: Secondary | ICD-10-CM | POA: Diagnosis not present

## 2017-01-18 DIAGNOSIS — I10 Essential (primary) hypertension: Secondary | ICD-10-CM | POA: Diagnosis not present

## 2017-01-18 DIAGNOSIS — J449 Chronic obstructive pulmonary disease, unspecified: Secondary | ICD-10-CM | POA: Diagnosis not present

## 2017-01-18 DIAGNOSIS — C159 Malignant neoplasm of esophagus, unspecified: Secondary | ICD-10-CM | POA: Diagnosis not present

## 2017-01-23 DIAGNOSIS — J9611 Chronic respiratory failure with hypoxia: Secondary | ICD-10-CM | POA: Diagnosis not present

## 2017-01-23 DIAGNOSIS — J449 Chronic obstructive pulmonary disease, unspecified: Secondary | ICD-10-CM | POA: Diagnosis not present

## 2017-02-01 DIAGNOSIS — Z8501 Personal history of malignant neoplasm of esophagus: Secondary | ICD-10-CM | POA: Diagnosis not present

## 2017-02-01 DIAGNOSIS — Z1211 Encounter for screening for malignant neoplasm of colon: Secondary | ICD-10-CM | POA: Diagnosis not present

## 2017-02-08 DIAGNOSIS — J449 Chronic obstructive pulmonary disease, unspecified: Secondary | ICD-10-CM | POA: Diagnosis not present

## 2017-02-17 DIAGNOSIS — C159 Malignant neoplasm of esophagus, unspecified: Secondary | ICD-10-CM | POA: Diagnosis not present

## 2017-02-17 DIAGNOSIS — J449 Chronic obstructive pulmonary disease, unspecified: Secondary | ICD-10-CM | POA: Diagnosis not present

## 2017-02-17 DIAGNOSIS — I10 Essential (primary) hypertension: Secondary | ICD-10-CM | POA: Diagnosis not present

## 2017-02-22 DIAGNOSIS — J9611 Chronic respiratory failure with hypoxia: Secondary | ICD-10-CM | POA: Diagnosis not present

## 2017-02-22 DIAGNOSIS — J449 Chronic obstructive pulmonary disease, unspecified: Secondary | ICD-10-CM | POA: Diagnosis not present

## 2017-03-01 DIAGNOSIS — J449 Chronic obstructive pulmonary disease, unspecified: Secondary | ICD-10-CM | POA: Diagnosis not present

## 2017-03-01 DIAGNOSIS — J9611 Chronic respiratory failure with hypoxia: Secondary | ICD-10-CM | POA: Diagnosis not present

## 2017-03-01 DIAGNOSIS — G4733 Obstructive sleep apnea (adult) (pediatric): Secondary | ICD-10-CM | POA: Diagnosis not present

## 2017-03-06 DIAGNOSIS — R5383 Other fatigue: Secondary | ICD-10-CM | POA: Diagnosis not present

## 2017-03-06 DIAGNOSIS — J449 Chronic obstructive pulmonary disease, unspecified: Secondary | ICD-10-CM | POA: Diagnosis not present

## 2017-03-06 DIAGNOSIS — G4733 Obstructive sleep apnea (adult) (pediatric): Secondary | ICD-10-CM | POA: Diagnosis not present

## 2017-03-11 DIAGNOSIS — J449 Chronic obstructive pulmonary disease, unspecified: Secondary | ICD-10-CM | POA: Diagnosis not present

## 2017-03-14 DIAGNOSIS — J441 Chronic obstructive pulmonary disease with (acute) exacerbation: Secondary | ICD-10-CM | POA: Diagnosis not present

## 2017-03-14 DIAGNOSIS — J9611 Chronic respiratory failure with hypoxia: Secondary | ICD-10-CM | POA: Diagnosis not present

## 2017-03-14 DIAGNOSIS — J449 Chronic obstructive pulmonary disease, unspecified: Secondary | ICD-10-CM | POA: Diagnosis not present

## 2017-03-25 DIAGNOSIS — J9611 Chronic respiratory failure with hypoxia: Secondary | ICD-10-CM | POA: Diagnosis not present

## 2017-03-25 DIAGNOSIS — J449 Chronic obstructive pulmonary disease, unspecified: Secondary | ICD-10-CM | POA: Diagnosis not present

## 2017-04-03 DIAGNOSIS — R69 Illness, unspecified: Secondary | ICD-10-CM | POA: Diagnosis not present

## 2017-04-14 DIAGNOSIS — J449 Chronic obstructive pulmonary disease, unspecified: Secondary | ICD-10-CM | POA: Diagnosis not present

## 2017-04-14 DIAGNOSIS — J441 Chronic obstructive pulmonary disease with (acute) exacerbation: Secondary | ICD-10-CM | POA: Diagnosis not present

## 2017-04-14 DIAGNOSIS — J9611 Chronic respiratory failure with hypoxia: Secondary | ICD-10-CM | POA: Diagnosis not present

## 2017-04-17 DIAGNOSIS — J9611 Chronic respiratory failure with hypoxia: Secondary | ICD-10-CM | POA: Diagnosis not present

## 2017-04-17 DIAGNOSIS — R5383 Other fatigue: Secondary | ICD-10-CM | POA: Diagnosis not present

## 2017-04-17 DIAGNOSIS — G4733 Obstructive sleep apnea (adult) (pediatric): Secondary | ICD-10-CM | POA: Diagnosis not present

## 2017-04-17 DIAGNOSIS — J449 Chronic obstructive pulmonary disease, unspecified: Secondary | ICD-10-CM | POA: Diagnosis not present

## 2017-05-04 DIAGNOSIS — J441 Chronic obstructive pulmonary disease with (acute) exacerbation: Secondary | ICD-10-CM | POA: Diagnosis not present

## 2017-05-04 DIAGNOSIS — K210 Gastro-esophageal reflux disease with esophagitis: Secondary | ICD-10-CM | POA: Diagnosis not present

## 2017-05-09 DIAGNOSIS — Z1211 Encounter for screening for malignant neoplasm of colon: Secondary | ICD-10-CM | POA: Diagnosis not present

## 2017-05-09 DIAGNOSIS — K5730 Diverticulosis of large intestine without perforation or abscess without bleeding: Secondary | ICD-10-CM | POA: Diagnosis not present

## 2017-05-09 DIAGNOSIS — K635 Polyp of colon: Secondary | ICD-10-CM | POA: Diagnosis not present

## 2017-05-09 DIAGNOSIS — D125 Benign neoplasm of sigmoid colon: Secondary | ICD-10-CM | POA: Diagnosis not present

## 2017-05-09 DIAGNOSIS — D122 Benign neoplasm of ascending colon: Secondary | ICD-10-CM | POA: Diagnosis not present

## 2017-05-12 DIAGNOSIS — J9611 Chronic respiratory failure with hypoxia: Secondary | ICD-10-CM | POA: Diagnosis not present

## 2017-05-12 DIAGNOSIS — J449 Chronic obstructive pulmonary disease, unspecified: Secondary | ICD-10-CM | POA: Diagnosis not present

## 2017-05-12 DIAGNOSIS — J441 Chronic obstructive pulmonary disease with (acute) exacerbation: Secondary | ICD-10-CM | POA: Diagnosis not present

## 2017-05-23 DIAGNOSIS — J449 Chronic obstructive pulmonary disease, unspecified: Secondary | ICD-10-CM | POA: Diagnosis not present

## 2017-05-23 DIAGNOSIS — J9611 Chronic respiratory failure with hypoxia: Secondary | ICD-10-CM | POA: Diagnosis not present

## 2017-06-12 DIAGNOSIS — J441 Chronic obstructive pulmonary disease with (acute) exacerbation: Secondary | ICD-10-CM | POA: Diagnosis not present

## 2017-06-12 DIAGNOSIS — J9611 Chronic respiratory failure with hypoxia: Secondary | ICD-10-CM | POA: Diagnosis not present

## 2017-06-12 DIAGNOSIS — J449 Chronic obstructive pulmonary disease, unspecified: Secondary | ICD-10-CM | POA: Diagnosis not present

## 2017-06-23 DIAGNOSIS — J9611 Chronic respiratory failure with hypoxia: Secondary | ICD-10-CM | POA: Diagnosis not present

## 2017-06-23 DIAGNOSIS — J449 Chronic obstructive pulmonary disease, unspecified: Secondary | ICD-10-CM | POA: Diagnosis not present

## 2017-07-12 DIAGNOSIS — J441 Chronic obstructive pulmonary disease with (acute) exacerbation: Secondary | ICD-10-CM | POA: Diagnosis not present

## 2017-07-12 DIAGNOSIS — J449 Chronic obstructive pulmonary disease, unspecified: Secondary | ICD-10-CM | POA: Diagnosis not present

## 2017-07-12 DIAGNOSIS — J9611 Chronic respiratory failure with hypoxia: Secondary | ICD-10-CM | POA: Diagnosis not present

## 2017-07-23 DIAGNOSIS — J449 Chronic obstructive pulmonary disease, unspecified: Secondary | ICD-10-CM | POA: Diagnosis not present

## 2017-07-23 DIAGNOSIS — J9611 Chronic respiratory failure with hypoxia: Secondary | ICD-10-CM | POA: Diagnosis not present

## 2017-07-30 DIAGNOSIS — G4733 Obstructive sleep apnea (adult) (pediatric): Secondary | ICD-10-CM | POA: Diagnosis not present

## 2017-07-31 DIAGNOSIS — R5383 Other fatigue: Secondary | ICD-10-CM | POA: Diagnosis not present

## 2017-07-31 DIAGNOSIS — J449 Chronic obstructive pulmonary disease, unspecified: Secondary | ICD-10-CM | POA: Diagnosis not present

## 2017-07-31 DIAGNOSIS — J9611 Chronic respiratory failure with hypoxia: Secondary | ICD-10-CM | POA: Diagnosis not present

## 2017-07-31 DIAGNOSIS — G4733 Obstructive sleep apnea (adult) (pediatric): Secondary | ICD-10-CM | POA: Diagnosis not present

## 2017-08-03 DIAGNOSIS — K591 Functional diarrhea: Secondary | ICD-10-CM | POA: Diagnosis not present

## 2017-08-10 DIAGNOSIS — J441 Chronic obstructive pulmonary disease with (acute) exacerbation: Secondary | ICD-10-CM | POA: Diagnosis not present

## 2017-08-10 DIAGNOSIS — K591 Functional diarrhea: Secondary | ICD-10-CM | POA: Diagnosis not present

## 2017-08-12 DIAGNOSIS — J449 Chronic obstructive pulmonary disease, unspecified: Secondary | ICD-10-CM | POA: Diagnosis not present

## 2017-08-12 DIAGNOSIS — J9611 Chronic respiratory failure with hypoxia: Secondary | ICD-10-CM | POA: Diagnosis not present

## 2017-08-12 DIAGNOSIS — J441 Chronic obstructive pulmonary disease with (acute) exacerbation: Secondary | ICD-10-CM | POA: Diagnosis not present

## 2017-08-16 DIAGNOSIS — G4733 Obstructive sleep apnea (adult) (pediatric): Secondary | ICD-10-CM | POA: Diagnosis not present

## 2017-08-16 DIAGNOSIS — J449 Chronic obstructive pulmonary disease, unspecified: Secondary | ICD-10-CM | POA: Diagnosis not present

## 2017-08-23 DIAGNOSIS — J449 Chronic obstructive pulmonary disease, unspecified: Secondary | ICD-10-CM | POA: Diagnosis not present

## 2017-08-23 DIAGNOSIS — J9611 Chronic respiratory failure with hypoxia: Secondary | ICD-10-CM | POA: Diagnosis not present

## 2017-08-28 DIAGNOSIS — A09 Infectious gastroenteritis and colitis, unspecified: Secondary | ICD-10-CM | POA: Diagnosis not present

## 2017-09-07 DIAGNOSIS — J441 Chronic obstructive pulmonary disease with (acute) exacerbation: Secondary | ICD-10-CM | POA: Diagnosis not present

## 2017-09-07 DIAGNOSIS — J439 Emphysema, unspecified: Secondary | ICD-10-CM | POA: Diagnosis not present

## 2017-09-07 DIAGNOSIS — R911 Solitary pulmonary nodule: Secondary | ICD-10-CM | POA: Diagnosis not present

## 2017-09-07 DIAGNOSIS — I700 Atherosclerosis of aorta: Secondary | ICD-10-CM | POA: Diagnosis not present

## 2017-09-07 DIAGNOSIS — J9611 Chronic respiratory failure with hypoxia: Secondary | ICD-10-CM | POA: Diagnosis not present

## 2017-09-07 DIAGNOSIS — R0600 Dyspnea, unspecified: Secondary | ICD-10-CM | POA: Diagnosis not present

## 2017-09-10 DIAGNOSIS — J9611 Chronic respiratory failure with hypoxia: Secondary | ICD-10-CM | POA: Diagnosis not present

## 2017-09-10 DIAGNOSIS — J449 Chronic obstructive pulmonary disease, unspecified: Secondary | ICD-10-CM | POA: Diagnosis not present

## 2017-09-10 DIAGNOSIS — J301 Allergic rhinitis due to pollen: Secondary | ICD-10-CM | POA: Diagnosis not present

## 2017-09-10 DIAGNOSIS — G4733 Obstructive sleep apnea (adult) (pediatric): Secondary | ICD-10-CM | POA: Diagnosis not present

## 2017-09-11 DIAGNOSIS — R197 Diarrhea, unspecified: Secondary | ICD-10-CM | POA: Diagnosis not present

## 2017-09-11 DIAGNOSIS — J441 Chronic obstructive pulmonary disease with (acute) exacerbation: Secondary | ICD-10-CM | POA: Diagnosis not present

## 2017-09-11 DIAGNOSIS — J449 Chronic obstructive pulmonary disease, unspecified: Secondary | ICD-10-CM | POA: Diagnosis not present

## 2017-09-11 DIAGNOSIS — J9611 Chronic respiratory failure with hypoxia: Secondary | ICD-10-CM | POA: Diagnosis not present

## 2017-09-22 DIAGNOSIS — J9611 Chronic respiratory failure with hypoxia: Secondary | ICD-10-CM | POA: Diagnosis not present

## 2017-09-22 DIAGNOSIS — J449 Chronic obstructive pulmonary disease, unspecified: Secondary | ICD-10-CM | POA: Diagnosis not present

## 2017-10-03 DIAGNOSIS — I2510 Atherosclerotic heart disease of native coronary artery without angina pectoris: Secondary | ICD-10-CM | POA: Diagnosis not present

## 2017-10-03 DIAGNOSIS — K5730 Diverticulosis of large intestine without perforation or abscess without bleeding: Secondary | ICD-10-CM | POA: Diagnosis not present

## 2017-10-03 DIAGNOSIS — K8020 Calculus of gallbladder without cholecystitis without obstruction: Secondary | ICD-10-CM | POA: Diagnosis not present

## 2017-10-03 DIAGNOSIS — J0100 Acute maxillary sinusitis, unspecified: Secondary | ICD-10-CM | POA: Diagnosis not present

## 2017-10-03 DIAGNOSIS — M47812 Spondylosis without myelopathy or radiculopathy, cervical region: Secondary | ICD-10-CM | POA: Diagnosis not present

## 2017-10-03 DIAGNOSIS — R918 Other nonspecific abnormal finding of lung field: Secondary | ICD-10-CM | POA: Diagnosis not present

## 2017-10-03 DIAGNOSIS — I700 Atherosclerosis of aorta: Secondary | ICD-10-CM | POA: Diagnosis not present

## 2017-10-03 DIAGNOSIS — J449 Chronic obstructive pulmonary disease, unspecified: Secondary | ICD-10-CM | POA: Diagnosis not present

## 2017-10-03 DIAGNOSIS — C159 Malignant neoplasm of esophagus, unspecified: Secondary | ICD-10-CM | POA: Diagnosis not present

## 2017-10-05 DIAGNOSIS — J9611 Chronic respiratory failure with hypoxia: Secondary | ICD-10-CM | POA: Diagnosis not present

## 2017-10-05 DIAGNOSIS — J449 Chronic obstructive pulmonary disease, unspecified: Secondary | ICD-10-CM | POA: Diagnosis not present

## 2017-10-05 DIAGNOSIS — R918 Other nonspecific abnormal finding of lung field: Secondary | ICD-10-CM | POA: Diagnosis not present

## 2017-10-05 DIAGNOSIS — G4733 Obstructive sleep apnea (adult) (pediatric): Secondary | ICD-10-CM | POA: Diagnosis not present

## 2017-10-12 DIAGNOSIS — J449 Chronic obstructive pulmonary disease, unspecified: Secondary | ICD-10-CM | POA: Diagnosis not present

## 2017-10-12 DIAGNOSIS — J441 Chronic obstructive pulmonary disease with (acute) exacerbation: Secondary | ICD-10-CM | POA: Diagnosis not present

## 2017-10-12 DIAGNOSIS — J9611 Chronic respiratory failure with hypoxia: Secondary | ICD-10-CM | POA: Diagnosis not present

## 2017-10-22 LAB — PULMONARY FUNCTION TEST

## 2017-10-23 DIAGNOSIS — J9611 Chronic respiratory failure with hypoxia: Secondary | ICD-10-CM | POA: Diagnosis not present

## 2017-10-23 DIAGNOSIS — J449 Chronic obstructive pulmonary disease, unspecified: Secondary | ICD-10-CM | POA: Diagnosis not present

## 2017-10-30 ENCOUNTER — Encounter: Payer: Medicare HMO | Admitting: Cardiothoracic Surgery

## 2017-11-02 ENCOUNTER — Other Ambulatory Visit: Payer: Self-pay

## 2017-11-02 ENCOUNTER — Institutional Professional Consult (permissible substitution): Payer: Medicare HMO | Admitting: Cardiothoracic Surgery

## 2017-11-02 ENCOUNTER — Encounter: Payer: Self-pay | Admitting: Cardiothoracic Surgery

## 2017-11-02 VITALS — BP 124/81 | HR 88 | Resp 16 | Ht 74.0 in | Wt 180.0 lb

## 2017-11-02 DIAGNOSIS — Z9889 Other specified postprocedural states: Secondary | ICD-10-CM | POA: Diagnosis not present

## 2017-11-02 DIAGNOSIS — J42 Unspecified chronic bronchitis: Secondary | ICD-10-CM

## 2017-11-02 DIAGNOSIS — R918 Other nonspecific abnormal finding of lung field: Secondary | ICD-10-CM | POA: Diagnosis not present

## 2017-11-02 DIAGNOSIS — Z9049 Acquired absence of other specified parts of digestive tract: Secondary | ICD-10-CM | POA: Diagnosis not present

## 2017-11-05 ENCOUNTER — Encounter: Payer: Self-pay | Admitting: Cardiothoracic Surgery

## 2017-11-05 NOTE — Progress Notes (Signed)
PCP is Lillard Anes, MD Referring Provider is Gardiner Rhyme, MD  Chief Complaint  Patient presents with  . Lung Lesion    LULOBE.Marland KitchenMarland KitchenSurgical eval, Chest CT 10/04/17, PET Scan 10/02/17, PFT's 10/05/17    HPI: Patient examined, CT scan of chest images personally reviewed and discussed with patient.  62 year old AA male ex-smoker with severe COPD on home oxygen presents for evaluation of abnormal scan.  Dr. Carren Rang has followed the patient with serial chest CT scans since 2013 the patient underwent Oringer esophagectomy at [facility] for GE junction adenocarcinoma.  There is no pathologic data from the surgical specimen.  Most recent CT scan showed a new 8 mm left upper lobe nodule with 3.2 SUV activity on PET scan.  The previous CT scan of the chest was 2017 which showed no nodular density.  There is no evidence of hypermetabolic mesenteric or mediastinal adenopathy.  Patient stopped smoking about 3 years ago.  Patient is short of breath with normal activity and has FEV1 30% predicted.  Lung windows show severe centrilobular emphysema.  Patient denies weight loss or other constitutional symptoms.  He does have some intermittent dull left chest pain.  Patient has history of thromboembolic disease and vena cava filter for DVT but is now off warfarin because of the recent subdural.  He has a sedentary functional status.  He uses CPAP and nebulized therapy.  CT scan images show the primary nodule to be fairly peripheral and next to a vessel as well as emphysematous lung.  At the current size I would expect the benefit of a transthoracic biopsy to be minimal and not worth the risk.  I would recommend follow-up CT scan in 3 to 4 months to determine the permanence of this nodule which also could be inflammatory. Past Medical History:  Diagnosis Date  . Asthma    hx of asthma as a child  . COPD (chronic obstructive pulmonary disease) ([place])   . Esophageal cancer ([place])   . Hx of cervical spine  surgery   . Hypertension   . Lung nodules   . Obstructive sleep apnea   . Pulmonary embolism [facility])     Past Surgical History:  Procedure Laterality Date  . CERVICAL SPINE SURGERY    . EUS  08/03/2011   Procedure: UPPER ENDOSCOPIC ULTRASOUND (EUS) LINEAR;  Surgeon: Milus Banister, MD;  Location: [facility] ENDOSCOPY;  Service: Endoscopy;  Laterality: N/A;  . stomach pull through surgery 2013  2013      . TONSILLECTOMY      Family History  Problem Relation Age of Onset  . Hypertension Father   . COPD Father   . Pancreatic cancer Sister     Social History Social History   Tobacco Use  . Smoking status: Former Smoker    Packs/day: 1.00    Years: 42.00    Pack years: 42.00    Types: Cigarettes    Last attempt to quit: 03/04/2014    Years since quitting: 3.6  . Smokeless tobacco: Never Used  Substance Use Topics  . Alcohol use: No  . Drug use: Yes    Types: Marijuana    Current Outpatient Medications  Medication Sig Dispense Refill  . ipratropium-albuterol (DUONEB) 0.5-2.5 (3) MG/3ML SOLN Take 3 mLs by nebulization 4 (four) times daily.    Marland Kitchen lisinopril (PRINIVIL,ZESTRIL) 20 MG tablet Take 20 mg by mouth daily.    Marland Kitchen omeprazole (PRILOSEC) 40 MG capsule Take 40 mg by mouth daily.    Marland Kitchen  umeclidinium-vilanterol (ANORO ELLIPTA) 62.5-25 MCG/INH AEPB Inhale 1 puff into the lungs daily.     No current facility-administered medications for this visit.     Allergies  Allergen Reactions  . Warfarin And Related Other (See Comments)    And other blood thinners d/t bleeding in the brain    Review of Systems  Weight stable No headache or dizziness from recent subdural hematoma Uses home oxygen No fever night sweats  BP 124/81 (BP Location: Right Arm, Patient Position: Sitting, Cuff Size: Large)   Pulse 88   Resp 16   Ht 6\' 2"  (1.88 m)   Wt 180 lb (81.6 kg)   SpO2 93% Comment: RA  BMI 23.11 kg/m  Physical Exam      Exam    General- alert and comfortable, chronically ill with  clear evidence of COPD    Neck- no JVD, no cervical adenopathy palpable, no carotid bruit   Lungs-diminished breath sounds with scattered rhonchi   Cor- regular rate and rhythm, no murmur , gallop   Abdomen- soft, non-tender   Extremities - warm, non-tender, minimal edema   Neuro- oriented, appropriate, no focal weakness   Diagnostic Tests: CT scan and PET scan images personally reviewed and counseled patient  Impression: New evidence of a small peripheral left upper lobe 8 mm primary nodule with a smaller 4 mm satellite nodule with low activity on PET scan of the primary nodule.  This could represent an early lung cancer but because of the small size, proximity of pulmonary artery branch and possibility of inflammatory etiology I would hold on transthoracic biopsy and repeat scan in 3 to 4 months.  Plan: Return in 3 to 4 months with CT chest with IV contrast.   Len Childs, MD [facility] [phone]

## 2017-11-06 ENCOUNTER — Encounter: Payer: Self-pay | Admitting: *Deleted

## 2017-11-06 LAB — PULMONARY FUNCTION TEST

## 2017-11-12 DIAGNOSIS — J441 Chronic obstructive pulmonary disease with (acute) exacerbation: Secondary | ICD-10-CM | POA: Diagnosis not present

## 2017-11-12 DIAGNOSIS — J9611 Chronic respiratory failure with hypoxia: Secondary | ICD-10-CM | POA: Diagnosis not present

## 2017-11-12 DIAGNOSIS — J449 Chronic obstructive pulmonary disease, unspecified: Secondary | ICD-10-CM | POA: Diagnosis not present

## 2017-11-13 DIAGNOSIS — Z6824 Body mass index (BMI) 24.0-24.9, adult: Secondary | ICD-10-CM | POA: Diagnosis not present

## 2017-11-13 DIAGNOSIS — Z0000 Encounter for general adult medical examination without abnormal findings: Secondary | ICD-10-CM | POA: Diagnosis not present

## 2017-11-13 DIAGNOSIS — J449 Chronic obstructive pulmonary disease, unspecified: Secondary | ICD-10-CM | POA: Diagnosis not present

## 2017-11-13 DIAGNOSIS — I10 Essential (primary) hypertension: Secondary | ICD-10-CM | POA: Diagnosis not present

## 2017-11-13 DIAGNOSIS — J9611 Chronic respiratory failure with hypoxia: Secondary | ICD-10-CM | POA: Diagnosis not present

## 2017-11-13 DIAGNOSIS — Z23 Encounter for immunization: Secondary | ICD-10-CM | POA: Diagnosis not present

## 2017-11-13 DIAGNOSIS — G4733 Obstructive sleep apnea (adult) (pediatric): Secondary | ICD-10-CM | POA: Diagnosis not present

## 2017-11-13 DIAGNOSIS — K210 Gastro-esophageal reflux disease with esophagitis: Secondary | ICD-10-CM | POA: Diagnosis not present

## 2017-11-23 DIAGNOSIS — J449 Chronic obstructive pulmonary disease, unspecified: Secondary | ICD-10-CM | POA: Diagnosis not present

## 2017-11-23 DIAGNOSIS — J9611 Chronic respiratory failure with hypoxia: Secondary | ICD-10-CM | POA: Diagnosis not present

## 2017-12-12 DIAGNOSIS — J449 Chronic obstructive pulmonary disease, unspecified: Secondary | ICD-10-CM | POA: Diagnosis not present

## 2017-12-12 DIAGNOSIS — J9611 Chronic respiratory failure with hypoxia: Secondary | ICD-10-CM | POA: Diagnosis not present

## 2017-12-12 DIAGNOSIS — J441 Chronic obstructive pulmonary disease with (acute) exacerbation: Secondary | ICD-10-CM | POA: Diagnosis not present

## 2017-12-23 DIAGNOSIS — J9611 Chronic respiratory failure with hypoxia: Secondary | ICD-10-CM | POA: Diagnosis not present

## 2017-12-23 DIAGNOSIS — J449 Chronic obstructive pulmonary disease, unspecified: Secondary | ICD-10-CM | POA: Diagnosis not present

## 2018-01-08 DIAGNOSIS — G4733 Obstructive sleep apnea (adult) (pediatric): Secondary | ICD-10-CM | POA: Diagnosis not present

## 2018-01-08 DIAGNOSIS — B370 Candidal stomatitis: Secondary | ICD-10-CM | POA: Diagnosis not present

## 2018-01-08 DIAGNOSIS — J449 Chronic obstructive pulmonary disease, unspecified: Secondary | ICD-10-CM | POA: Diagnosis not present

## 2018-01-08 DIAGNOSIS — R918 Other nonspecific abnormal finding of lung field: Secondary | ICD-10-CM | POA: Diagnosis not present

## 2018-01-12 DIAGNOSIS — J9611 Chronic respiratory failure with hypoxia: Secondary | ICD-10-CM | POA: Diagnosis not present

## 2018-01-12 DIAGNOSIS — J449 Chronic obstructive pulmonary disease, unspecified: Secondary | ICD-10-CM | POA: Diagnosis not present

## 2018-01-12 DIAGNOSIS — J441 Chronic obstructive pulmonary disease with (acute) exacerbation: Secondary | ICD-10-CM | POA: Diagnosis not present

## 2018-01-23 DIAGNOSIS — J449 Chronic obstructive pulmonary disease, unspecified: Secondary | ICD-10-CM | POA: Diagnosis not present

## 2018-01-23 DIAGNOSIS — J9611 Chronic respiratory failure with hypoxia: Secondary | ICD-10-CM | POA: Diagnosis not present

## 2018-01-28 DIAGNOSIS — J441 Chronic obstructive pulmonary disease with (acute) exacerbation: Secondary | ICD-10-CM | POA: Diagnosis not present

## 2018-01-30 ENCOUNTER — Encounter: Payer: Medicare HMO | Admitting: Cardiothoracic Surgery

## 2018-02-04 DIAGNOSIS — Z6824 Body mass index (BMI) 24.0-24.9, adult: Secondary | ICD-10-CM | POA: Diagnosis not present

## 2018-02-04 DIAGNOSIS — J441 Chronic obstructive pulmonary disease with (acute) exacerbation: Secondary | ICD-10-CM | POA: Diagnosis not present

## 2018-02-11 DIAGNOSIS — J441 Chronic obstructive pulmonary disease with (acute) exacerbation: Secondary | ICD-10-CM | POA: Diagnosis not present

## 2018-02-11 DIAGNOSIS — R05 Cough: Secondary | ICD-10-CM | POA: Diagnosis not present

## 2018-02-11 DIAGNOSIS — G4733 Obstructive sleep apnea (adult) (pediatric): Secondary | ICD-10-CM | POA: Diagnosis not present

## 2018-02-11 DIAGNOSIS — J449 Chronic obstructive pulmonary disease, unspecified: Secondary | ICD-10-CM | POA: Diagnosis not present

## 2018-02-11 DIAGNOSIS — R509 Fever, unspecified: Secondary | ICD-10-CM | POA: Diagnosis not present

## 2018-02-11 DIAGNOSIS — J9611 Chronic respiratory failure with hypoxia: Secondary | ICD-10-CM | POA: Diagnosis not present

## 2018-02-11 DIAGNOSIS — R918 Other nonspecific abnormal finding of lung field: Secondary | ICD-10-CM | POA: Diagnosis not present

## 2018-02-22 DIAGNOSIS — J449 Chronic obstructive pulmonary disease, unspecified: Secondary | ICD-10-CM | POA: Diagnosis not present

## 2018-02-22 DIAGNOSIS — J9611 Chronic respiratory failure with hypoxia: Secondary | ICD-10-CM | POA: Diagnosis not present

## 2018-03-14 DIAGNOSIS — J449 Chronic obstructive pulmonary disease, unspecified: Secondary | ICD-10-CM | POA: Diagnosis not present

## 2018-03-14 DIAGNOSIS — J9611 Chronic respiratory failure with hypoxia: Secondary | ICD-10-CM | POA: Diagnosis not present

## 2018-03-25 DIAGNOSIS — J9611 Chronic respiratory failure with hypoxia: Secondary | ICD-10-CM | POA: Diagnosis not present

## 2018-03-25 DIAGNOSIS — J449 Chronic obstructive pulmonary disease, unspecified: Secondary | ICD-10-CM | POA: Diagnosis not present

## 2018-04-14 DIAGNOSIS — J9611 Chronic respiratory failure with hypoxia: Secondary | ICD-10-CM | POA: Diagnosis not present

## 2018-04-14 DIAGNOSIS — J449 Chronic obstructive pulmonary disease, unspecified: Secondary | ICD-10-CM | POA: Diagnosis not present

## 2018-04-25 DIAGNOSIS — J449 Chronic obstructive pulmonary disease, unspecified: Secondary | ICD-10-CM | POA: Diagnosis not present

## 2018-04-25 DIAGNOSIS — J9611 Chronic respiratory failure with hypoxia: Secondary | ICD-10-CM | POA: Diagnosis not present

## 2018-05-13 DIAGNOSIS — J449 Chronic obstructive pulmonary disease, unspecified: Secondary | ICD-10-CM | POA: Diagnosis not present

## 2018-05-13 DIAGNOSIS — J9611 Chronic respiratory failure with hypoxia: Secondary | ICD-10-CM | POA: Diagnosis not present

## 2018-05-24 DIAGNOSIS — J449 Chronic obstructive pulmonary disease, unspecified: Secondary | ICD-10-CM | POA: Diagnosis not present

## 2018-05-24 DIAGNOSIS — J9611 Chronic respiratory failure with hypoxia: Secondary | ICD-10-CM | POA: Diagnosis not present

## 2018-06-12 ENCOUNTER — Encounter: Payer: Medicare HMO | Admitting: Cardiothoracic Surgery

## 2018-06-13 DIAGNOSIS — J449 Chronic obstructive pulmonary disease, unspecified: Secondary | ICD-10-CM | POA: Diagnosis not present

## 2018-06-13 DIAGNOSIS — J9611 Chronic respiratory failure with hypoxia: Secondary | ICD-10-CM | POA: Diagnosis not present

## 2018-06-24 DIAGNOSIS — J449 Chronic obstructive pulmonary disease, unspecified: Secondary | ICD-10-CM | POA: Diagnosis not present

## 2018-06-24 DIAGNOSIS — J9611 Chronic respiratory failure with hypoxia: Secondary | ICD-10-CM | POA: Diagnosis not present

## 2018-07-13 DIAGNOSIS — J449 Chronic obstructive pulmonary disease, unspecified: Secondary | ICD-10-CM | POA: Diagnosis not present

## 2018-07-13 DIAGNOSIS — J9611 Chronic respiratory failure with hypoxia: Secondary | ICD-10-CM | POA: Diagnosis not present

## 2018-07-24 DIAGNOSIS — J9611 Chronic respiratory failure with hypoxia: Secondary | ICD-10-CM | POA: Diagnosis not present

## 2018-07-24 DIAGNOSIS — J449 Chronic obstructive pulmonary disease, unspecified: Secondary | ICD-10-CM | POA: Diagnosis not present

## 2018-08-13 DIAGNOSIS — J449 Chronic obstructive pulmonary disease, unspecified: Secondary | ICD-10-CM | POA: Diagnosis not present

## 2018-08-13 DIAGNOSIS — J9611 Chronic respiratory failure with hypoxia: Secondary | ICD-10-CM | POA: Diagnosis not present

## 2018-08-24 DIAGNOSIS — J449 Chronic obstructive pulmonary disease, unspecified: Secondary | ICD-10-CM | POA: Diagnosis not present

## 2018-08-24 DIAGNOSIS — J9611 Chronic respiratory failure with hypoxia: Secondary | ICD-10-CM | POA: Diagnosis not present

## 2018-09-12 DIAGNOSIS — J449 Chronic obstructive pulmonary disease, unspecified: Secondary | ICD-10-CM | POA: Diagnosis not present

## 2018-09-12 DIAGNOSIS — J9611 Chronic respiratory failure with hypoxia: Secondary | ICD-10-CM | POA: Diagnosis not present

## 2018-09-23 DIAGNOSIS — J9611 Chronic respiratory failure with hypoxia: Secondary | ICD-10-CM | POA: Diagnosis not present

## 2018-09-23 DIAGNOSIS — J449 Chronic obstructive pulmonary disease, unspecified: Secondary | ICD-10-CM | POA: Diagnosis not present

## 2018-10-01 DIAGNOSIS — R918 Other nonspecific abnormal finding of lung field: Secondary | ICD-10-CM | POA: Diagnosis not present

## 2018-10-01 DIAGNOSIS — G4733 Obstructive sleep apnea (adult) (pediatric): Secondary | ICD-10-CM | POA: Diagnosis not present

## 2018-10-01 DIAGNOSIS — J9611 Chronic respiratory failure with hypoxia: Secondary | ICD-10-CM | POA: Diagnosis not present

## 2018-10-01 DIAGNOSIS — J449 Chronic obstructive pulmonary disease, unspecified: Secondary | ICD-10-CM | POA: Diagnosis not present

## 2018-10-04 DIAGNOSIS — I10 Essential (primary) hypertension: Secondary | ICD-10-CM | POA: Diagnosis not present

## 2018-10-04 DIAGNOSIS — G4733 Obstructive sleep apnea (adult) (pediatric): Secondary | ICD-10-CM | POA: Diagnosis not present

## 2018-10-04 DIAGNOSIS — J9610 Chronic respiratory failure, unspecified whether with hypoxia or hypercapnia: Secondary | ICD-10-CM | POA: Diagnosis not present

## 2018-10-04 DIAGNOSIS — Z1159 Encounter for screening for other viral diseases: Secondary | ICD-10-CM | POA: Diagnosis not present

## 2018-10-04 DIAGNOSIS — K210 Gastro-esophageal reflux disease with esophagitis: Secondary | ICD-10-CM | POA: Diagnosis not present

## 2018-10-04 DIAGNOSIS — Z6822 Body mass index (BMI) 22.0-22.9, adult: Secondary | ICD-10-CM | POA: Diagnosis not present

## 2018-10-04 DIAGNOSIS — J449 Chronic obstructive pulmonary disease, unspecified: Secondary | ICD-10-CM | POA: Diagnosis not present

## 2018-10-13 DIAGNOSIS — J9611 Chronic respiratory failure with hypoxia: Secondary | ICD-10-CM | POA: Diagnosis not present

## 2018-10-13 DIAGNOSIS — J449 Chronic obstructive pulmonary disease, unspecified: Secondary | ICD-10-CM | POA: Diagnosis not present

## 2018-10-24 DIAGNOSIS — J9611 Chronic respiratory failure with hypoxia: Secondary | ICD-10-CM | POA: Diagnosis not present

## 2018-10-24 DIAGNOSIS — J449 Chronic obstructive pulmonary disease, unspecified: Secondary | ICD-10-CM | POA: Diagnosis not present

## 2018-11-13 DIAGNOSIS — J449 Chronic obstructive pulmonary disease, unspecified: Secondary | ICD-10-CM | POA: Diagnosis not present

## 2018-11-13 DIAGNOSIS — J9611 Chronic respiratory failure with hypoxia: Secondary | ICD-10-CM | POA: Diagnosis not present

## 2018-11-24 DIAGNOSIS — J9611 Chronic respiratory failure with hypoxia: Secondary | ICD-10-CM | POA: Diagnosis not present

## 2018-11-24 DIAGNOSIS — J449 Chronic obstructive pulmonary disease, unspecified: Secondary | ICD-10-CM | POA: Diagnosis not present

## 2018-12-06 DIAGNOSIS — J441 Chronic obstructive pulmonary disease with (acute) exacerbation: Secondary | ICD-10-CM | POA: Diagnosis not present

## 2018-12-06 DIAGNOSIS — Z1159 Encounter for screening for other viral diseases: Secondary | ICD-10-CM | POA: Diagnosis not present

## 2018-12-06 DIAGNOSIS — Z6823 Body mass index (BMI) 23.0-23.9, adult: Secondary | ICD-10-CM | POA: Diagnosis not present

## 2018-12-13 DIAGNOSIS — J449 Chronic obstructive pulmonary disease, unspecified: Secondary | ICD-10-CM | POA: Diagnosis not present

## 2018-12-13 DIAGNOSIS — J9611 Chronic respiratory failure with hypoxia: Secondary | ICD-10-CM | POA: Diagnosis not present

## 2018-12-24 DIAGNOSIS — J449 Chronic obstructive pulmonary disease, unspecified: Secondary | ICD-10-CM | POA: Diagnosis not present

## 2018-12-24 DIAGNOSIS — J9611 Chronic respiratory failure with hypoxia: Secondary | ICD-10-CM | POA: Diagnosis not present

## 2019-01-06 DIAGNOSIS — R079 Chest pain, unspecified: Secondary | ICD-10-CM | POA: Diagnosis not present

## 2019-01-06 DIAGNOSIS — Z1159 Encounter for screening for other viral diseases: Secondary | ICD-10-CM | POA: Diagnosis not present

## 2019-01-13 DIAGNOSIS — J449 Chronic obstructive pulmonary disease, unspecified: Secondary | ICD-10-CM | POA: Diagnosis not present

## 2019-01-13 DIAGNOSIS — J9611 Chronic respiratory failure with hypoxia: Secondary | ICD-10-CM | POA: Diagnosis not present

## 2019-01-24 DIAGNOSIS — J449 Chronic obstructive pulmonary disease, unspecified: Secondary | ICD-10-CM | POA: Diagnosis not present

## 2019-01-24 DIAGNOSIS — J9611 Chronic respiratory failure with hypoxia: Secondary | ICD-10-CM | POA: Diagnosis not present

## 2019-01-27 DIAGNOSIS — Z1159 Encounter for screening for other viral diseases: Secondary | ICD-10-CM | POA: Diagnosis not present

## 2019-01-27 DIAGNOSIS — R079 Chest pain, unspecified: Secondary | ICD-10-CM | POA: Diagnosis not present

## 2019-01-30 DIAGNOSIS — J439 Emphysema, unspecified: Secondary | ICD-10-CM | POA: Diagnosis not present

## 2019-01-30 DIAGNOSIS — R9389 Abnormal findings on diagnostic imaging of other specified body structures: Secondary | ICD-10-CM | POA: Diagnosis not present

## 2019-01-30 DIAGNOSIS — R911 Solitary pulmonary nodule: Secondary | ICD-10-CM | POA: Diagnosis not present

## 2019-01-31 DIAGNOSIS — R918 Other nonspecific abnormal finding of lung field: Secondary | ICD-10-CM | POA: Diagnosis not present

## 2019-01-31 DIAGNOSIS — R911 Solitary pulmonary nodule: Secondary | ICD-10-CM | POA: Diagnosis not present

## 2019-01-31 DIAGNOSIS — Z8589 Personal history of malignant neoplasm of other organs and systems: Secondary | ICD-10-CM | POA: Diagnosis not present

## 2019-02-03 DIAGNOSIS — I10 Essential (primary) hypertension: Secondary | ICD-10-CM | POA: Diagnosis not present

## 2019-02-03 DIAGNOSIS — I208 Other forms of angina pectoris: Secondary | ICD-10-CM | POA: Diagnosis not present

## 2019-02-03 DIAGNOSIS — Z23 Encounter for immunization: Secondary | ICD-10-CM | POA: Diagnosis not present

## 2019-02-03 DIAGNOSIS — Z6823 Body mass index (BMI) 23.0-23.9, adult: Secondary | ICD-10-CM | POA: Diagnosis not present

## 2019-02-03 DIAGNOSIS — Z1159 Encounter for screening for other viral diseases: Secondary | ICD-10-CM | POA: Diagnosis not present

## 2019-02-03 DIAGNOSIS — K2100 Gastro-esophageal reflux disease with esophagitis, without bleeding: Secondary | ICD-10-CM | POA: Diagnosis not present

## 2019-02-03 DIAGNOSIS — J449 Chronic obstructive pulmonary disease, unspecified: Secondary | ICD-10-CM | POA: Diagnosis not present

## 2019-02-03 DIAGNOSIS — R222 Localized swelling, mass and lump, trunk: Secondary | ICD-10-CM | POA: Diagnosis not present

## 2019-02-05 DIAGNOSIS — G4733 Obstructive sleep apnea (adult) (pediatric): Secondary | ICD-10-CM | POA: Diagnosis not present

## 2019-02-05 DIAGNOSIS — R918 Other nonspecific abnormal finding of lung field: Secondary | ICD-10-CM | POA: Diagnosis not present

## 2019-02-05 DIAGNOSIS — J449 Chronic obstructive pulmonary disease, unspecified: Secondary | ICD-10-CM | POA: Diagnosis not present

## 2019-02-05 DIAGNOSIS — J9611 Chronic respiratory failure with hypoxia: Secondary | ICD-10-CM | POA: Diagnosis not present

## 2019-02-12 DIAGNOSIS — J9611 Chronic respiratory failure with hypoxia: Secondary | ICD-10-CM | POA: Diagnosis not present

## 2019-02-12 DIAGNOSIS — J449 Chronic obstructive pulmonary disease, unspecified: Secondary | ICD-10-CM | POA: Diagnosis not present

## 2019-02-13 DIAGNOSIS — R918 Other nonspecific abnormal finding of lung field: Secondary | ICD-10-CM | POA: Diagnosis not present

## 2019-02-13 DIAGNOSIS — J984 Other disorders of lung: Secondary | ICD-10-CM | POA: Diagnosis not present

## 2019-02-13 DIAGNOSIS — Z7901 Long term (current) use of anticoagulants: Secondary | ICD-10-CM | POA: Diagnosis not present

## 2019-02-13 DIAGNOSIS — C3412 Malignant neoplasm of upper lobe, left bronchus or lung: Secondary | ICD-10-CM | POA: Diagnosis not present

## 2019-02-18 DIAGNOSIS — C3411 Malignant neoplasm of upper lobe, right bronchus or lung: Secondary | ICD-10-CM | POA: Diagnosis not present

## 2019-02-18 DIAGNOSIS — C155 Malignant neoplasm of lower third of esophagus: Secondary | ICD-10-CM | POA: Diagnosis not present

## 2019-02-23 DIAGNOSIS — J9611 Chronic respiratory failure with hypoxia: Secondary | ICD-10-CM | POA: Diagnosis not present

## 2019-02-23 DIAGNOSIS — J449 Chronic obstructive pulmonary disease, unspecified: Secondary | ICD-10-CM | POA: Diagnosis not present

## 2019-03-10 DIAGNOSIS — C3490 Malignant neoplasm of unspecified part of unspecified bronchus or lung: Secondary | ICD-10-CM | POA: Diagnosis not present

## 2019-03-10 DIAGNOSIS — C3412 Malignant neoplasm of upper lobe, left bronchus or lung: Secondary | ICD-10-CM | POA: Diagnosis not present

## 2019-03-12 DIAGNOSIS — C3412 Malignant neoplasm of upper lobe, left bronchus or lung: Secondary | ICD-10-CM | POA: Diagnosis not present

## 2019-03-12 DIAGNOSIS — R918 Other nonspecific abnormal finding of lung field: Secondary | ICD-10-CM | POA: Diagnosis not present

## 2019-03-13 DIAGNOSIS — C61 Malignant neoplasm of prostate: Secondary | ICD-10-CM | POA: Diagnosis not present

## 2019-03-13 DIAGNOSIS — C155 Malignant neoplasm of lower third of esophagus: Secondary | ICD-10-CM | POA: Diagnosis not present

## 2019-03-14 DIAGNOSIS — Z85118 Personal history of other malignant neoplasm of bronchus and lung: Secondary | ICD-10-CM | POA: Diagnosis not present

## 2019-03-14 DIAGNOSIS — J449 Chronic obstructive pulmonary disease, unspecified: Secondary | ICD-10-CM | POA: Diagnosis not present

## 2019-03-14 DIAGNOSIS — Z87891 Personal history of nicotine dependence: Secondary | ICD-10-CM | POA: Diagnosis not present

## 2019-03-14 DIAGNOSIS — Z20822 Contact with and (suspected) exposure to covid-19: Secondary | ICD-10-CM | POA: Diagnosis not present

## 2019-03-14 DIAGNOSIS — I119 Hypertensive heart disease without heart failure: Secondary | ICD-10-CM | POA: Diagnosis not present

## 2019-03-14 DIAGNOSIS — R509 Fever, unspecified: Secondary | ICD-10-CM | POA: Diagnosis not present

## 2019-03-15 DIAGNOSIS — J449 Chronic obstructive pulmonary disease, unspecified: Secondary | ICD-10-CM | POA: Diagnosis not present

## 2019-03-15 DIAGNOSIS — J9611 Chronic respiratory failure with hypoxia: Secondary | ICD-10-CM | POA: Diagnosis not present

## 2019-03-18 DIAGNOSIS — C155 Malignant neoplasm of lower third of esophagus: Secondary | ICD-10-CM | POA: Diagnosis not present

## 2019-03-18 DIAGNOSIS — Z5111 Encounter for antineoplastic chemotherapy: Secondary | ICD-10-CM | POA: Diagnosis not present

## 2019-03-21 DIAGNOSIS — C3412 Malignant neoplasm of upper lobe, left bronchus or lung: Secondary | ICD-10-CM | POA: Diagnosis not present

## 2019-03-26 DIAGNOSIS — J9611 Chronic respiratory failure with hypoxia: Secondary | ICD-10-CM | POA: Diagnosis not present

## 2019-03-26 DIAGNOSIS — J449 Chronic obstructive pulmonary disease, unspecified: Secondary | ICD-10-CM | POA: Diagnosis not present

## 2019-03-27 DIAGNOSIS — C155 Malignant neoplasm of lower third of esophagus: Secondary | ICD-10-CM | POA: Diagnosis not present

## 2019-03-27 DIAGNOSIS — Z7901 Long term (current) use of anticoagulants: Secondary | ICD-10-CM | POA: Diagnosis not present

## 2019-03-27 DIAGNOSIS — R509 Fever, unspecified: Secondary | ICD-10-CM | POA: Diagnosis not present

## 2019-04-01 DIAGNOSIS — C3412 Malignant neoplasm of upper lobe, left bronchus or lung: Secondary | ICD-10-CM | POA: Diagnosis not present

## 2019-04-01 DIAGNOSIS — C155 Malignant neoplasm of lower third of esophagus: Secondary | ICD-10-CM | POA: Diagnosis not present

## 2019-04-01 DIAGNOSIS — Z510 Encounter for antineoplastic radiation therapy: Secondary | ICD-10-CM | POA: Diagnosis not present

## 2019-04-02 DIAGNOSIS — C155 Malignant neoplasm of lower third of esophagus: Secondary | ICD-10-CM | POA: Diagnosis not present

## 2019-04-04 DIAGNOSIS — Z510 Encounter for antineoplastic radiation therapy: Secondary | ICD-10-CM | POA: Diagnosis not present

## 2019-04-04 DIAGNOSIS — C155 Malignant neoplasm of lower third of esophagus: Secondary | ICD-10-CM | POA: Diagnosis not present

## 2019-04-04 DIAGNOSIS — C3412 Malignant neoplasm of upper lobe, left bronchus or lung: Secondary | ICD-10-CM | POA: Diagnosis not present

## 2019-04-07 DIAGNOSIS — C155 Malignant neoplasm of lower third of esophagus: Secondary | ICD-10-CM | POA: Diagnosis not present

## 2019-04-07 DIAGNOSIS — Z510 Encounter for antineoplastic radiation therapy: Secondary | ICD-10-CM | POA: Diagnosis not present

## 2019-04-09 DIAGNOSIS — Z510 Encounter for antineoplastic radiation therapy: Secondary | ICD-10-CM | POA: Diagnosis not present

## 2019-04-09 DIAGNOSIS — C3412 Malignant neoplasm of upper lobe, left bronchus or lung: Secondary | ICD-10-CM | POA: Diagnosis not present

## 2019-04-09 DIAGNOSIS — C155 Malignant neoplasm of lower third of esophagus: Secondary | ICD-10-CM | POA: Diagnosis not present

## 2019-04-10 DIAGNOSIS — C155 Malignant neoplasm of lower third of esophagus: Secondary | ICD-10-CM | POA: Diagnosis not present

## 2019-04-10 DIAGNOSIS — C3412 Malignant neoplasm of upper lobe, left bronchus or lung: Secondary | ICD-10-CM | POA: Diagnosis not present

## 2019-04-10 DIAGNOSIS — Z510 Encounter for antineoplastic radiation therapy: Secondary | ICD-10-CM | POA: Diagnosis not present

## 2019-04-11 DIAGNOSIS — Z510 Encounter for antineoplastic radiation therapy: Secondary | ICD-10-CM | POA: Diagnosis not present

## 2019-04-11 DIAGNOSIS — C155 Malignant neoplasm of lower third of esophagus: Secondary | ICD-10-CM | POA: Diagnosis not present

## 2019-04-11 DIAGNOSIS — C3412 Malignant neoplasm of upper lobe, left bronchus or lung: Secondary | ICD-10-CM | POA: Diagnosis not present

## 2019-04-14 DIAGNOSIS — Z510 Encounter for antineoplastic radiation therapy: Secondary | ICD-10-CM | POA: Diagnosis not present

## 2019-04-14 DIAGNOSIS — C155 Malignant neoplasm of lower third of esophagus: Secondary | ICD-10-CM | POA: Diagnosis not present

## 2019-04-14 DIAGNOSIS — C3412 Malignant neoplasm of upper lobe, left bronchus or lung: Secondary | ICD-10-CM | POA: Diagnosis not present

## 2019-04-15 DIAGNOSIS — Z510 Encounter for antineoplastic radiation therapy: Secondary | ICD-10-CM | POA: Diagnosis not present

## 2019-04-15 DIAGNOSIS — J449 Chronic obstructive pulmonary disease, unspecified: Secondary | ICD-10-CM | POA: Diagnosis not present

## 2019-04-15 DIAGNOSIS — C3412 Malignant neoplasm of upper lobe, left bronchus or lung: Secondary | ICD-10-CM | POA: Diagnosis not present

## 2019-04-15 DIAGNOSIS — J9611 Chronic respiratory failure with hypoxia: Secondary | ICD-10-CM | POA: Diagnosis not present

## 2019-04-15 DIAGNOSIS — C155 Malignant neoplasm of lower third of esophagus: Secondary | ICD-10-CM | POA: Diagnosis not present

## 2019-04-16 DIAGNOSIS — C3412 Malignant neoplasm of upper lobe, left bronchus or lung: Secondary | ICD-10-CM | POA: Diagnosis not present

## 2019-04-16 DIAGNOSIS — Z510 Encounter for antineoplastic radiation therapy: Secondary | ICD-10-CM | POA: Diagnosis not present

## 2019-04-16 DIAGNOSIS — Z5111 Encounter for antineoplastic chemotherapy: Secondary | ICD-10-CM | POA: Diagnosis not present

## 2019-04-16 DIAGNOSIS — C155 Malignant neoplasm of lower third of esophagus: Secondary | ICD-10-CM | POA: Diagnosis not present

## 2019-04-18 DIAGNOSIS — C155 Malignant neoplasm of lower third of esophagus: Secondary | ICD-10-CM | POA: Diagnosis not present

## 2019-04-18 DIAGNOSIS — Z510 Encounter for antineoplastic radiation therapy: Secondary | ICD-10-CM | POA: Diagnosis not present

## 2019-04-18 DIAGNOSIS — C3412 Malignant neoplasm of upper lobe, left bronchus or lung: Secondary | ICD-10-CM | POA: Diagnosis not present

## 2019-04-21 DIAGNOSIS — C155 Malignant neoplasm of lower third of esophagus: Secondary | ICD-10-CM | POA: Diagnosis not present

## 2019-04-21 DIAGNOSIS — C3412 Malignant neoplasm of upper lobe, left bronchus or lung: Secondary | ICD-10-CM | POA: Diagnosis not present

## 2019-04-21 DIAGNOSIS — Z510 Encounter for antineoplastic radiation therapy: Secondary | ICD-10-CM | POA: Diagnosis not present

## 2019-04-22 DIAGNOSIS — Z510 Encounter for antineoplastic radiation therapy: Secondary | ICD-10-CM | POA: Diagnosis not present

## 2019-04-22 DIAGNOSIS — C155 Malignant neoplasm of lower third of esophagus: Secondary | ICD-10-CM | POA: Diagnosis not present

## 2019-04-23 DIAGNOSIS — C3412 Malignant neoplasm of upper lobe, left bronchus or lung: Secondary | ICD-10-CM | POA: Diagnosis not present

## 2019-04-23 DIAGNOSIS — D649 Anemia, unspecified: Secondary | ICD-10-CM | POA: Diagnosis not present

## 2019-04-24 DIAGNOSIS — C155 Malignant neoplasm of lower third of esophagus: Secondary | ICD-10-CM | POA: Diagnosis not present

## 2019-04-24 DIAGNOSIS — C3412 Malignant neoplasm of upper lobe, left bronchus or lung: Secondary | ICD-10-CM | POA: Diagnosis not present

## 2019-04-24 DIAGNOSIS — Z510 Encounter for antineoplastic radiation therapy: Secondary | ICD-10-CM | POA: Diagnosis not present

## 2019-04-25 DIAGNOSIS — C155 Malignant neoplasm of lower third of esophagus: Secondary | ICD-10-CM | POA: Diagnosis not present

## 2019-04-25 DIAGNOSIS — C3412 Malignant neoplasm of upper lobe, left bronchus or lung: Secondary | ICD-10-CM | POA: Diagnosis not present

## 2019-04-25 DIAGNOSIS — Z510 Encounter for antineoplastic radiation therapy: Secondary | ICD-10-CM | POA: Diagnosis not present

## 2019-04-26 DIAGNOSIS — J9611 Chronic respiratory failure with hypoxia: Secondary | ICD-10-CM | POA: Diagnosis not present

## 2019-04-26 DIAGNOSIS — J449 Chronic obstructive pulmonary disease, unspecified: Secondary | ICD-10-CM | POA: Diagnosis not present

## 2019-04-28 DIAGNOSIS — C155 Malignant neoplasm of lower third of esophagus: Secondary | ICD-10-CM | POA: Diagnosis not present

## 2019-04-28 DIAGNOSIS — Z510 Encounter for antineoplastic radiation therapy: Secondary | ICD-10-CM | POA: Diagnosis not present

## 2019-04-28 DIAGNOSIS — C3412 Malignant neoplasm of upper lobe, left bronchus or lung: Secondary | ICD-10-CM | POA: Diagnosis not present

## 2019-04-29 DIAGNOSIS — C155 Malignant neoplasm of lower third of esophagus: Secondary | ICD-10-CM | POA: Diagnosis not present

## 2019-04-29 DIAGNOSIS — C3412 Malignant neoplasm of upper lobe, left bronchus or lung: Secondary | ICD-10-CM | POA: Diagnosis not present

## 2019-04-29 DIAGNOSIS — Z510 Encounter for antineoplastic radiation therapy: Secondary | ICD-10-CM | POA: Diagnosis not present

## 2019-04-30 DIAGNOSIS — Z5111 Encounter for antineoplastic chemotherapy: Secondary | ICD-10-CM | POA: Diagnosis not present

## 2019-04-30 DIAGNOSIS — C155 Malignant neoplasm of lower third of esophagus: Secondary | ICD-10-CM | POA: Diagnosis not present

## 2019-04-30 DIAGNOSIS — C3412 Malignant neoplasm of upper lobe, left bronchus or lung: Secondary | ICD-10-CM | POA: Diagnosis not present

## 2019-05-01 DIAGNOSIS — C155 Malignant neoplasm of lower third of esophagus: Secondary | ICD-10-CM | POA: Diagnosis not present

## 2019-05-01 DIAGNOSIS — C3412 Malignant neoplasm of upper lobe, left bronchus or lung: Secondary | ICD-10-CM | POA: Diagnosis not present

## 2019-05-01 DIAGNOSIS — Z510 Encounter for antineoplastic radiation therapy: Secondary | ICD-10-CM | POA: Diagnosis not present

## 2019-05-02 DIAGNOSIS — C61 Malignant neoplasm of prostate: Secondary | ICD-10-CM | POA: Diagnosis not present

## 2019-05-02 DIAGNOSIS — C3412 Malignant neoplasm of upper lobe, left bronchus or lung: Secondary | ICD-10-CM | POA: Diagnosis not present

## 2019-05-02 DIAGNOSIS — Z510 Encounter for antineoplastic radiation therapy: Secondary | ICD-10-CM | POA: Diagnosis not present

## 2019-05-05 DIAGNOSIS — Z510 Encounter for antineoplastic radiation therapy: Secondary | ICD-10-CM | POA: Diagnosis not present

## 2019-05-05 DIAGNOSIS — C155 Malignant neoplasm of lower third of esophagus: Secondary | ICD-10-CM | POA: Diagnosis not present

## 2019-05-05 DIAGNOSIS — C3412 Malignant neoplasm of upper lobe, left bronchus or lung: Secondary | ICD-10-CM | POA: Diagnosis not present

## 2019-05-06 DIAGNOSIS — Z510 Encounter for antineoplastic radiation therapy: Secondary | ICD-10-CM | POA: Diagnosis not present

## 2019-05-06 DIAGNOSIS — C155 Malignant neoplasm of lower third of esophagus: Secondary | ICD-10-CM | POA: Diagnosis not present

## 2019-05-06 DIAGNOSIS — C3412 Malignant neoplasm of upper lobe, left bronchus or lung: Secondary | ICD-10-CM | POA: Diagnosis not present

## 2019-05-07 DIAGNOSIS — C155 Malignant neoplasm of lower third of esophagus: Secondary | ICD-10-CM | POA: Diagnosis not present

## 2019-05-07 DIAGNOSIS — C3412 Malignant neoplasm of upper lobe, left bronchus or lung: Secondary | ICD-10-CM | POA: Diagnosis not present

## 2019-05-08 DIAGNOSIS — C3412 Malignant neoplasm of upper lobe, left bronchus or lung: Secondary | ICD-10-CM | POA: Diagnosis not present

## 2019-05-08 DIAGNOSIS — C155 Malignant neoplasm of lower third of esophagus: Secondary | ICD-10-CM | POA: Diagnosis not present

## 2019-05-08 DIAGNOSIS — Z510 Encounter for antineoplastic radiation therapy: Secondary | ICD-10-CM | POA: Diagnosis not present

## 2019-05-09 DIAGNOSIS — C155 Malignant neoplasm of lower third of esophagus: Secondary | ICD-10-CM | POA: Diagnosis not present

## 2019-05-09 DIAGNOSIS — C3412 Malignant neoplasm of upper lobe, left bronchus or lung: Secondary | ICD-10-CM | POA: Diagnosis not present

## 2019-05-09 DIAGNOSIS — Z510 Encounter for antineoplastic radiation therapy: Secondary | ICD-10-CM | POA: Diagnosis not present

## 2019-05-12 DIAGNOSIS — C3412 Malignant neoplasm of upper lobe, left bronchus or lung: Secondary | ICD-10-CM | POA: Diagnosis not present

## 2019-05-12 DIAGNOSIS — C155 Malignant neoplasm of lower third of esophagus: Secondary | ICD-10-CM | POA: Diagnosis not present

## 2019-05-12 DIAGNOSIS — Z510 Encounter for antineoplastic radiation therapy: Secondary | ICD-10-CM | POA: Diagnosis not present

## 2019-05-13 DIAGNOSIS — C3412 Malignant neoplasm of upper lobe, left bronchus or lung: Secondary | ICD-10-CM | POA: Diagnosis not present

## 2019-05-13 DIAGNOSIS — J449 Chronic obstructive pulmonary disease, unspecified: Secondary | ICD-10-CM | POA: Diagnosis not present

## 2019-05-13 DIAGNOSIS — C155 Malignant neoplasm of lower third of esophagus: Secondary | ICD-10-CM | POA: Diagnosis not present

## 2019-05-13 DIAGNOSIS — Z510 Encounter for antineoplastic radiation therapy: Secondary | ICD-10-CM | POA: Diagnosis not present

## 2019-05-13 DIAGNOSIS — R918 Other nonspecific abnormal finding of lung field: Secondary | ICD-10-CM | POA: Diagnosis not present

## 2019-05-13 DIAGNOSIS — G4733 Obstructive sleep apnea (adult) (pediatric): Secondary | ICD-10-CM | POA: Diagnosis not present

## 2019-05-13 DIAGNOSIS — J9611 Chronic respiratory failure with hypoxia: Secondary | ICD-10-CM | POA: Diagnosis not present

## 2019-05-14 DIAGNOSIS — C155 Malignant neoplasm of lower third of esophagus: Secondary | ICD-10-CM | POA: Diagnosis not present

## 2019-05-14 DIAGNOSIS — Z5111 Encounter for antineoplastic chemotherapy: Secondary | ICD-10-CM | POA: Diagnosis not present

## 2019-05-14 DIAGNOSIS — Z510 Encounter for antineoplastic radiation therapy: Secondary | ICD-10-CM | POA: Diagnosis not present

## 2019-05-15 DIAGNOSIS — C155 Malignant neoplasm of lower third of esophagus: Secondary | ICD-10-CM | POA: Diagnosis not present

## 2019-05-15 DIAGNOSIS — Z510 Encounter for antineoplastic radiation therapy: Secondary | ICD-10-CM | POA: Diagnosis not present

## 2019-05-16 DIAGNOSIS — Z510 Encounter for antineoplastic radiation therapy: Secondary | ICD-10-CM | POA: Diagnosis not present

## 2019-05-16 DIAGNOSIS — C155 Malignant neoplasm of lower third of esophagus: Secondary | ICD-10-CM | POA: Diagnosis not present

## 2019-05-19 DIAGNOSIS — C155 Malignant neoplasm of lower third of esophagus: Secondary | ICD-10-CM | POA: Diagnosis not present

## 2019-05-19 DIAGNOSIS — C3412 Malignant neoplasm of upper lobe, left bronchus or lung: Secondary | ICD-10-CM | POA: Diagnosis not present

## 2019-05-19 DIAGNOSIS — Z510 Encounter for antineoplastic radiation therapy: Secondary | ICD-10-CM | POA: Diagnosis not present

## 2019-05-20 DIAGNOSIS — C155 Malignant neoplasm of lower third of esophagus: Secondary | ICD-10-CM | POA: Diagnosis not present

## 2019-05-20 DIAGNOSIS — Z510 Encounter for antineoplastic radiation therapy: Secondary | ICD-10-CM | POA: Diagnosis not present

## 2019-05-20 DIAGNOSIS — C3412 Malignant neoplasm of upper lobe, left bronchus or lung: Secondary | ICD-10-CM | POA: Diagnosis not present

## 2019-05-21 DIAGNOSIS — C155 Malignant neoplasm of lower third of esophagus: Secondary | ICD-10-CM | POA: Diagnosis not present

## 2019-05-21 DIAGNOSIS — C3412 Malignant neoplasm of upper lobe, left bronchus or lung: Secondary | ICD-10-CM | POA: Diagnosis not present

## 2019-05-22 DIAGNOSIS — C3412 Malignant neoplasm of upper lobe, left bronchus or lung: Secondary | ICD-10-CM | POA: Diagnosis not present

## 2019-05-23 DIAGNOSIS — C3412 Malignant neoplasm of upper lobe, left bronchus or lung: Secondary | ICD-10-CM | POA: Diagnosis not present

## 2019-05-23 DIAGNOSIS — Z510 Encounter for antineoplastic radiation therapy: Secondary | ICD-10-CM | POA: Diagnosis not present

## 2019-05-23 DIAGNOSIS — C155 Malignant neoplasm of lower third of esophagus: Secondary | ICD-10-CM | POA: Diagnosis not present

## 2019-05-24 DIAGNOSIS — J9611 Chronic respiratory failure with hypoxia: Secondary | ICD-10-CM | POA: Diagnosis not present

## 2019-05-24 DIAGNOSIS — J449 Chronic obstructive pulmonary disease, unspecified: Secondary | ICD-10-CM | POA: Diagnosis not present

## 2019-05-26 DIAGNOSIS — C3412 Malignant neoplasm of upper lobe, left bronchus or lung: Secondary | ICD-10-CM | POA: Diagnosis not present

## 2019-05-26 DIAGNOSIS — C155 Malignant neoplasm of lower third of esophagus: Secondary | ICD-10-CM | POA: Diagnosis not present

## 2019-05-26 DIAGNOSIS — Z510 Encounter for antineoplastic radiation therapy: Secondary | ICD-10-CM | POA: Diagnosis not present

## 2019-06-11 DIAGNOSIS — C3412 Malignant neoplasm of upper lobe, left bronchus or lung: Secondary | ICD-10-CM | POA: Diagnosis not present

## 2019-06-11 DIAGNOSIS — Z8501 Personal history of malignant neoplasm of esophagus: Secondary | ICD-10-CM | POA: Diagnosis not present

## 2019-06-13 DIAGNOSIS — C3412 Malignant neoplasm of upper lobe, left bronchus or lung: Secondary | ICD-10-CM | POA: Diagnosis not present

## 2019-06-13 DIAGNOSIS — J9611 Chronic respiratory failure with hypoxia: Secondary | ICD-10-CM | POA: Diagnosis not present

## 2019-06-13 DIAGNOSIS — Z8501 Personal history of malignant neoplasm of esophagus: Secondary | ICD-10-CM | POA: Diagnosis not present

## 2019-06-13 DIAGNOSIS — J449 Chronic obstructive pulmonary disease, unspecified: Secondary | ICD-10-CM | POA: Diagnosis not present

## 2019-06-18 DIAGNOSIS — G4733 Obstructive sleep apnea (adult) (pediatric): Secondary | ICD-10-CM | POA: Diagnosis not present

## 2019-06-18 DIAGNOSIS — R918 Other nonspecific abnormal finding of lung field: Secondary | ICD-10-CM | POA: Diagnosis not present

## 2019-06-18 DIAGNOSIS — J449 Chronic obstructive pulmonary disease, unspecified: Secondary | ICD-10-CM | POA: Diagnosis not present

## 2019-06-18 DIAGNOSIS — J9611 Chronic respiratory failure with hypoxia: Secondary | ICD-10-CM | POA: Diagnosis not present

## 2019-06-24 DIAGNOSIS — J9611 Chronic respiratory failure with hypoxia: Secondary | ICD-10-CM | POA: Diagnosis not present

## 2019-06-24 DIAGNOSIS — J449 Chronic obstructive pulmonary disease, unspecified: Secondary | ICD-10-CM | POA: Diagnosis not present

## 2019-06-26 DIAGNOSIS — C3492 Malignant neoplasm of unspecified part of left bronchus or lung: Secondary | ICD-10-CM | POA: Diagnosis not present

## 2019-07-02 DIAGNOSIS — R918 Other nonspecific abnormal finding of lung field: Secondary | ICD-10-CM | POA: Diagnosis not present

## 2019-07-02 DIAGNOSIS — G4733 Obstructive sleep apnea (adult) (pediatric): Secondary | ICD-10-CM | POA: Diagnosis not present

## 2019-07-02 DIAGNOSIS — J449 Chronic obstructive pulmonary disease, unspecified: Secondary | ICD-10-CM | POA: Diagnosis not present

## 2019-07-02 DIAGNOSIS — J9611 Chronic respiratory failure with hypoxia: Secondary | ICD-10-CM | POA: Diagnosis not present

## 2019-07-13 DIAGNOSIS — J449 Chronic obstructive pulmonary disease, unspecified: Secondary | ICD-10-CM | POA: Diagnosis not present

## 2019-07-13 DIAGNOSIS — J9611 Chronic respiratory failure with hypoxia: Secondary | ICD-10-CM | POA: Diagnosis not present

## 2019-07-18 DIAGNOSIS — R918 Other nonspecific abnormal finding of lung field: Secondary | ICD-10-CM | POA: Diagnosis not present

## 2019-07-18 DIAGNOSIS — J441 Chronic obstructive pulmonary disease with (acute) exacerbation: Secondary | ICD-10-CM | POA: Diagnosis not present

## 2019-07-18 DIAGNOSIS — J9611 Chronic respiratory failure with hypoxia: Secondary | ICD-10-CM | POA: Diagnosis not present

## 2019-07-18 DIAGNOSIS — G4733 Obstructive sleep apnea (adult) (pediatric): Secondary | ICD-10-CM | POA: Diagnosis not present

## 2019-07-21 DIAGNOSIS — J441 Chronic obstructive pulmonary disease with (acute) exacerbation: Secondary | ICD-10-CM | POA: Diagnosis not present

## 2019-07-21 DIAGNOSIS — R918 Other nonspecific abnormal finding of lung field: Secondary | ICD-10-CM | POA: Diagnosis not present

## 2019-07-21 DIAGNOSIS — G4733 Obstructive sleep apnea (adult) (pediatric): Secondary | ICD-10-CM | POA: Diagnosis not present

## 2019-07-21 DIAGNOSIS — J9611 Chronic respiratory failure with hypoxia: Secondary | ICD-10-CM | POA: Diagnosis not present

## 2019-07-22 DIAGNOSIS — C3412 Malignant neoplasm of upper lobe, left bronchus or lung: Secondary | ICD-10-CM | POA: Diagnosis not present

## 2019-07-22 DIAGNOSIS — C3490 Malignant neoplasm of unspecified part of unspecified bronchus or lung: Secondary | ICD-10-CM | POA: Diagnosis not present

## 2019-07-22 DIAGNOSIS — C159 Malignant neoplasm of esophagus, unspecified: Secondary | ICD-10-CM | POA: Diagnosis not present

## 2019-07-23 DIAGNOSIS — C155 Malignant neoplasm of lower third of esophagus: Secondary | ICD-10-CM | POA: Diagnosis not present

## 2019-07-23 DIAGNOSIS — C3412 Malignant neoplasm of upper lobe, left bronchus or lung: Secondary | ICD-10-CM | POA: Diagnosis not present

## 2019-07-24 DIAGNOSIS — J449 Chronic obstructive pulmonary disease, unspecified: Secondary | ICD-10-CM | POA: Diagnosis not present

## 2019-07-24 DIAGNOSIS — J9611 Chronic respiratory failure with hypoxia: Secondary | ICD-10-CM | POA: Diagnosis not present

## 2019-08-07 ENCOUNTER — Ambulatory Visit: Payer: Medicare HMO

## 2019-08-12 ENCOUNTER — Other Ambulatory Visit: Payer: Self-pay

## 2019-08-12 ENCOUNTER — Ambulatory Visit (INDEPENDENT_AMBULATORY_CARE_PROVIDER_SITE_OTHER): Payer: Medicare HMO

## 2019-08-12 VITALS — BP 100/62 | HR 88 | Temp 98.2°F | Resp 16 | Ht 74.0 in | Wt 142.6 lb

## 2019-08-12 DIAGNOSIS — Z0000 Encounter for general adult medical examination without abnormal findings: Secondary | ICD-10-CM | POA: Diagnosis not present

## 2019-08-13 DIAGNOSIS — J449 Chronic obstructive pulmonary disease, unspecified: Secondary | ICD-10-CM | POA: Diagnosis not present

## 2019-08-13 DIAGNOSIS — J9611 Chronic respiratory failure with hypoxia: Secondary | ICD-10-CM | POA: Diagnosis not present

## 2019-08-13 NOTE — Progress Notes (Signed)
Subjective:   [name] [name]. is a 64 y.o. male who presents for Medicare Annual/Subsequent preventive examination.  This wellness visit is conducted by a nurse.  The patient's medications were reviewed and reconciled since the patient's last visit.  History details were provided by the patient.  The history appears to be reliable.    Patient's last AWV was more than a year ago.   Medical History: Patient history and Family history was reviewed  Medications, Allergies, and preventative health maintenance was reviewed and updated.   Review of Systems:  Review of Systems  Constitutional: Positive for unexpected weight change.       Unintentional weight loss of 40 lbs in 6 months  HENT: Negative.   Respiratory: Positive for shortness of breath.        2L of oxygen  Cardiovascular: Negative.  Negative for chest pain and palpitations.  Genitourinary: Negative.   Musculoskeletal: Positive for arthralgias.   Cardiac Risk Factors include: advanced age (>69men, >43 women);male gender     Objective:    Vitals: BP 100/62 (BP Location: Left Arm, Patient Position: Sitting, Cuff Size: Large)   Pulse 88   Temp 98.2 F (36.8 C) (Temporal)   Resp 16   Ht 6\' 2"  (1.88 m)   Wt 142 lb 9.6 oz (64.7 kg)   SpO2 95% Comment: 2L  BMI 18.31 kg/m   Body mass index is 18.31 kg/m.  Advanced Directives 08/12/2019 08/03/2011  Does Patient Have a Medical Advance Directive? No Patient does not have advance directive  Would patient like information on creating a medical advance directive? Yes (MAU/Ambulatory/Procedural Areas - Information given) -  Pre-existing out of facility DNR order (yellow form or pink MOST form) - No    Tobacco Social History   Tobacco Use  Smoking Status Former Smoker  . Packs/day: 1.00  . Years: 42.00  . Pack years: 42.00  . Types: Cigarettes  . Quit date: 03/04/2014  . Years since quitting: 5.4  Smokeless Tobacco Never Used     Counseling given: Not  Answered   Clinical Intake:  Pre-visit preparation completed: Yes  Pain : 0-10 Pain Score: 3  Pain Type: Neuropathic pain (right hip down leg shooting pain) Pain Location: Hip Pain Orientation: Right Pain Descriptors / Indicators: Shooting Pain Onset: 1 to 4 weeks ago Pain Frequency: Intermittent     Nutritional Risks: Unintentional weight loss Diabetes: No  How often do you need to have someone help you when you read instructions, pamphlets, or other written materials from your doctor or pharmacy?: 1 - Never  Interpreter Needed?: No     Past Medical History:  Diagnosis Date  . Asthma    hx of asthma as a child  . COPD (chronic obstructive pulmonary disease) ([place])   . Esophageal cancer ([place])   . Hx of cervical spine surgery   . Hypertension   . Lung nodules   . Obstructive sleep apnea   . Pulmonary embolism [facility])    Past Surgical History:  Procedure Laterality Date  . CERVICAL SPINE SURGERY    . EUS  08/03/2011   Procedure: UPPER ENDOSCOPIC ULTRASOUND (EUS) LINEAR;  Surgeon: Milus Banister, MD;  Location: [facility] ENDOSCOPY;  Service: Endoscopy;  Laterality: N/A;  . stomach pull through surgery 2013  2013      . TONSILLECTOMY     Family History  Problem Relation Age of Onset  . Hypertension Father   . COPD Father   . Pancreatic cancer Sister  Social History   Socioeconomic History  . Marital status: Married    Spouse name: Not on file  . Number of children: Not on file  . Years of education: Not on file  . Highest education level: Not on file  Occupational History  . Not on file  Tobacco Use  . Smoking status: Former Smoker    Packs/day: 1.00    Years: 42.00    Pack years: 42.00    Types: Cigarettes    Quit date: 03/04/2014    Years since quitting: 5.4  . Smokeless tobacco: Never Used  Vaping Use  . Vaping Use: Unknown  Substance and Sexual Activity  . Alcohol use: No  . Drug use: Yes    Types: Marijuana  . Sexual activity: Yes    Birth  control/protection: None  Other Topics Concern  . Not on file  Social History Narrative   Lives with spouse   Social Determinants of Health   Financial Resource Strain:   . Difficulty of Paying Living Expenses:   Food Insecurity:   . Worried About Charity fundraiser in the Last Year:   . Arboriculturist in the Last Year:   Transportation Needs:   . Film/video editor (Medical):   Marland Kitchen Lack of Transportation (Non-Medical):   Physical Activity:   . Days of Exercise per Week:   . Minutes of Exercise per Session:   Stress:   . Feeling of Stress :   Social Connections:   . Frequency of Communication with Friends and Family:   . Frequency of Social Gatherings with Friends and Family:   . Attends Religious Services:   . Active Member of Clubs or Organizations:   . Attends Archivist Meetings:   Marland Kitchen Marital Status:     Outpatient Encounter Medications as of 08/12/2019  Medication Sig  . ipratropium-albuterol (DUONEB) 0.5-2.5 (3) MG/3ML SOLN Take 3 mLs by nebulization 4 (four) times daily.  Marland Kitchen omeprazole (PRILOSEC) 40 MG capsule Take 40 mg by mouth daily.  . [DISCONTINUED] lisinopril (PRINIVIL,ZESTRIL) 20 MG tablet Take 20 mg by mouth daily.  . [DISCONTINUED] umeclidinium-vilanterol (ANORO ELLIPTA) 62.5-25 MCG/INH AEPB Inhale 1 puff into the lungs daily.   No facility-administered encounter medications on file as of 08/12/2019.    Activities of Daily Living In your present state of health, do you have any difficulty performing the following activities: 08/12/2019  Hearing? N  Vision? N  Difficulty concentrating or making decisions? N  Walking or climbing stairs? Y  Dressing or bathing? N  Doing errands, shopping? N  Preparing Food and eating ? N  Using the Toilet? N  In the past six months, have you accidently leaked urine? N  Do you have problems with loss of bowel control? N  Managing your Medications? N  Managing your Finances? N  Housekeeping or managing your  Housekeeping? N  Some recent data might be hidden    Patient Care Team: Lillard Anes, MD as PCP - General (Family Medicine) Marice Potter, MD as Consulting Physician (Oncology) Gardiner Rhyme, MD as Referring Physician (Specialist)   Assessment:   This is a routine wellness examination for [place].  Exercise Activities and Dietary recommendations Current Exercise Habits: Home exercise routine, Type of exercise: stretching, Time (Minutes): 10, Frequency (Times/Week): 3, Weekly Exercise (Minutes/Week): 30, Intensity: Mild, Exercise limited by: respiratory conditions(s)  Goals    . Gain weight     Goal weight 170 lbs.  Fall Risk Fall Risk  08/12/2019  Falls in the past year? 0  Number falls in past yr: 0  Injury with Fall? 0   Is the patient's home free of loose throw rugs in walkways, pet beds, electrical cords, etc?   yes      Grab bars in the bathroom? no      Handrails on the stairs?   yes      Adequate lighting?   yes   Depression Screen PHQ 2/9 Scores 08/12/2019  PHQ - 2 Score 3  PHQ- 9 Score 5    Cognitive Function     6CIT Screen 08/13/2019  What Year? 0 points  What month? 0 points  What time? 0 points  Count back from 20 0 points  Months in reverse 0 points  Repeat phrase 0 points  Total Score 0    Immunization History  Administered Date(s) Administered  . Influenza-Unspecified 10/28/2017    Screening Tests Health Maintenance  Topic Date Due  . Hepatitis C Screening  Never done  . COVID-19 Vaccine (1) Never done  . HIV Screening  Never done  . TETANUS/TDAP  Never done  . INFLUENZA VACCINE  09/28/2019  . COLONOSCOPY  05/10/2022   Cancer Screenings: Lung: Low Dose CT Chest recommended if Age 41-80 years, 30 pack-year currently smoking OR have quit w/in 15years. Patient does not qualify. Colorectal: Due 2024     Plan:    Counseling was provided today regarding the following topics: healthy eating habits, home safety, vitamin  and mineral supplementation (Multivitiman, boost), regular exercise, tobacco avoidance, limitation of alcohol intake, use of seat belts, firearm safety, and fall prevention.  Annual recommendations include: influenza vaccine, dental cleanings, and eye exams.  Appointment made with Dr Henrene Pastor in regards to right hip pain  Get COVID-19 Vaccine  I have personally reviewed and noted the following in the patient's chart:   . Medical and social history . Use of alcohol, tobacco or illicit drugs  . Current medications and supplements . Functional ability and status . Nutritional status . Physical activity . Advanced directives . List of other physicians . Hospitalizations, surgeries, and ER visits in previous 12 months . Vitals . Screenings to include cognitive, depression, and falls . Referrals and appointments  In addition, I have reviewed and discussed with patient certain preventive protocols, quality metrics, and best practice recommendations. A written personalized care plan for preventive services as well as general preventive health recommendations were provided to patient.     Erie Noe, LPN  [phone]

## 2019-08-13 NOTE — Patient Instructions (Addendum)
Fall Prevention in the Home, Adult Falls can cause injuries. They can happen to people of all ages. There are many things you can do to make your home safe and to help prevent falls. Ask for help when making these changes, if needed. What actions can I take to prevent falls? General Instructions  Use good lighting in all rooms. Replace any light bulbs that burn out.  Turn on the lights when you go into a dark area. Use night-lights.  Keep items that you use often in easy-to-reach places. Lower the shelves around your home if necessary.  Set up your furniture so you have a clear path. Avoid moving your furniture around.  Do not have throw rugs and other things on the floor that can make you trip.  Avoid walking on wet floors.  If any of your floors are uneven, fix them.  Add color or contrast paint or tape to clearly mark and help you see: ? Any grab bars or handrails. ? First and last steps of stairways. ? Where the edge of each step is.  If you use a stepladder: ? Make sure that it is fully opened. Do not climb a closed stepladder. ? Make sure that both sides of the stepladder are locked into place. ? Ask someone to hold the stepladder for you while you use it.  If there are any pets around you, be aware of where they are. What can I do in the bathroom?      Keep the floor dry. Clean up any water that spills onto the floor as soon as it happens.  Remove soap buildup in the tub or shower regularly.  Use non-skid mats or decals on the floor of the tub or shower.  Attach bath mats securely with double-sided, non-slip rug tape.  If you need to sit down in the shower, use a plastic, non-slip stool.  Install grab bars by the toilet and in the tub and shower. Do not use towel bars as grab bars. What can I do in the bedroom?  Make sure that you have a light by your bed that is easy to reach.  Do not use any sheets or blankets that are too big for your bed. They should  not hang down onto the floor.  Have a firm chair that has side arms. You can use this for support while you get dressed. What can I do in the kitchen?  Clean up any spills right away.  If you need to reach something above you, use a strong step stool that has a grab bar.  Keep electrical cords out of the way.  Do not use floor polish or wax that makes floors slippery. If you must use wax, use non-skid floor wax. What can I do with my stairs?  Do not leave any items on the stairs.  Make sure that you have a light switch at the top of the stairs and the bottom of the stairs. If you do not have them, ask someone to add them for you.  Make sure that there are handrails on both sides of the stairs, and use them. Fix handrails that are broken or loose. Make sure that handrails are as long as the stairways.  Install non-slip stair treads on all stairs in your home.  Avoid having throw rugs at the top or bottom of the stairs. If you do have throw rugs, attach them to the floor with carpet tape.  Choose a carpet that  does not hide the edge of the steps on the stairway.  Check any carpeting to make sure that it is firmly attached to the stairs. Fix any carpet that is loose or worn. What can I do on the outside of my home?  Use bright outdoor lighting.  Regularly fix the edges of walkways and driveways and fix any cracks.  Remove anything that might make you trip as you walk through a door, such as a raised step or threshold.  Trim any bushes or trees on the path to your home.  Regularly check to see if handrails are loose or broken. Make sure that both sides of any steps have handrails.  Install guardrails along the edges of any raised decks and porches.  Clear walking paths of anything that might make someone trip, such as tools or rocks.  Have any leaves, snow, or ice cleared regularly.  Use sand or salt on walking paths during winter.  Clean up any spills in your garage right  away. This includes grease or oil spills. What other actions can I take?  Wear shoes that: ? Have a low heel. Do not wear high heels. ? Have rubber bottoms. ? Are comfortable and fit you well. ? Are closed at the toe. Do not wear open-toe sandals.  Use tools that help you move around (mobility aids) if they are needed. These include: ? Canes. ? Walkers. ? Scooters. ? Crutches.  Review your medicines with your doctor. Some medicines can make you feel dizzy. This can increase your chance of falling. Ask your doctor what other things you can do to help prevent falls. Where to find more information  Centers for Disease Control and Prevention, STEADI: https://garcia.biz/  [employer] on Aging: BrainJudge.co.uk Contact a doctor if:  You are afraid of falling at home.  You feel weak, drowsy, or dizzy at home.  You fall at home. Summary  There are many simple things that you can do to make your home safe and to help prevent falls.  Ways to make your home safe include removing tripping hazards and installing grab bars in the bathroom.  Ask for help when making these changes in your home. This information is not intended to replace advice given to you by your health care provider. Make sure you discuss any questions you have with your health care provider. Document Revised: 06/06/2018 Document Reviewed: 09/28/2016 Elsevier Patient Education  2020 [place] Maintenance, Male Adopting a healthy lifestyle and getting preventive care are important in promoting health and wellness. Ask your health care provider about:  The right schedule for you to have regular tests and exams.  Things you can do on your own to prevent diseases and keep yourself healthy. What should I know about diet, weight, and exercise? Eat a healthy diet   Eat a diet that includes plenty of vegetables, fruits, low-fat dairy products, and lean protein.  Do not eat a lot of foods  that are high in solid fats, added sugars, or sodium. Maintain a healthy weight Body mass index (BMI) is a measurement that can be used to identify possible weight problems. It estimates body fat based on height and weight. Your health care provider can help determine your BMI and help you achieve or maintain a healthy weight. Get regular exercise Get regular exercise. This is one of the most important things you can do for your health. Most adults should:  Exercise for at least 150 minutes each week. The  exercise should increase your heart rate and make you sweat (moderate-intensity exercise).  Do strengthening exercises at least twice a week. This is in addition to the moderate-intensity exercise.  Spend less time sitting. Even light physical activity can be beneficial. Watch cholesterol and blood lipids Have your blood tested for lipids and cholesterol at 63 years of age, then have this test every 5 years. You may need to have your cholesterol levels checked more often if:  Your lipid or cholesterol levels are high.  You are older than 63 years of age.  You are at high risk for heart disease. What should I know about cancer screening? Many types of cancers can be detected early and may often be prevented. Depending on your health history and family history, you may need to have cancer screening at various ages. This may include screening for:  Colorectal cancer.  Prostate cancer.  Skin cancer.  Lung cancer. What should I know about heart disease, diabetes, and high blood pressure? Blood pressure and heart disease  High blood pressure causes heart disease and increases the risk of stroke. This is more likely to develop in people who have high blood pressure readings, are of African descent, or are overweight.  Talk with your health care provider about your target blood pressure readings.  Have your blood pressure checked: ? Every 3-5 years if you are 18-39 years of  age. ? Every year if you are 1 years old or older.  If you are between the ages of 39 and 55 and are a current or former smoker, ask your health care provider if you should have a one-time screening for abdominal aortic aneurysm (AAA). Diabetes Have regular diabetes screenings. This checks your fasting blood sugar level. Have the screening done:  Once every three years after age 46 if you are at a normal weight and have a low risk for diabetes.  More often and at a younger age if you are overweight or have a high risk for diabetes. What should I know about preventing infection? Hepatitis B If you have a higher risk for hepatitis B, you should be screened for this virus. Talk with your health care provider to find out if you are at risk for hepatitis B infection. Hepatitis C Blood testing is recommended for:  Everyone born from [birth year] through 1964.  Anyone with known risk factors for hepatitis C. Sexually transmitted infections (STIs)  You should be screened each year for STIs, including gonorrhea and chlamydia, if: ? You are sexually active and are younger than 63 years of age. ? You are older than 63 years of age and your health care provider tells you that you are at risk for this type of infection. ? Your sexual activity has changed since you were last screened, and you are at increased risk for chlamydia or gonorrhea. Ask your health care provider if you are at risk.  Ask your health care provider about whether you are at high risk for HIV. Your health care provider may recommend a prescription medicine to help prevent HIV infection. If you choose to take medicine to prevent HIV, you should first get tested for HIV. You should then be tested every 3 months for as long as you are taking the medicine. Follow these instructions at home: Lifestyle  Do not use any products that contain nicotine or tobacco, such as cigarettes, e-cigarettes, and chewing tobacco. If you need help quitting,  ask your health care provider.  Do not use street  drugs.  Do not share needles.  Ask your health care provider for help if you need support or information about quitting drugs. Alcohol use  Do not drink alcohol if your health care provider tells you not to drink.  If you drink alcohol: ? Limit how much you have to 0-2 drinks a day. ? Be aware of how much alcohol is in your drink. In the [place] one drink equals one 12 oz bottle of beer (355 mL), one 5 oz glass of wine (148 mL), or one 1 oz glass of hard liquor (44 mL). General instructions  Schedule regular health, dental, and eye exams.  Stay current with your vaccines.  Tell your health care provider if: ? You often feel depressed. ? You have ever been abused or do not feel safe at home. Summary  Adopting a healthy lifestyle and getting preventive care are important in promoting health and wellness.  Follow your health care provider's instructions about healthy diet, exercising, and getting tested or screened for diseases.  Follow your health care provider's instructions on monitoring your cholesterol and blood pressure. This information is not intended to replace advice given to you by your health care provider. Make sure you discuss any questions you have with your health care provider. Document Revised: 02/06/2018 Document Reviewed: 02/06/2018 Elsevier Patient Education  2020 [employer].   [name] [name] , Thank you for taking time to come for your Medicare Wellness Visit. I appreciate your ongoing commitment to your health goals. Please review the following plan we discussed and let me know if I can assist you in the future.   These are the goals we discussed: Goals    . Gain weight     Goal weight 170 lbs.       This is a list of the screening recommended for you and due dates:  Health Maintenance  Topic Date Due  . COVID-19 Vaccine (1) Never done  . Tetanus Vaccine  Never done  . Flu Shot  09/28/2019  .  Colon Cancer Screening  05/10/2022

## 2019-08-15 DIAGNOSIS — R918 Other nonspecific abnormal finding of lung field: Secondary | ICD-10-CM | POA: Diagnosis not present

## 2019-08-15 DIAGNOSIS — J449 Chronic obstructive pulmonary disease, unspecified: Secondary | ICD-10-CM | POA: Diagnosis not present

## 2019-08-15 DIAGNOSIS — G4733 Obstructive sleep apnea (adult) (pediatric): Secondary | ICD-10-CM | POA: Diagnosis not present

## 2019-08-15 DIAGNOSIS — J9611 Chronic respiratory failure with hypoxia: Secondary | ICD-10-CM | POA: Diagnosis not present

## 2019-08-18 ENCOUNTER — Other Ambulatory Visit: Payer: Self-pay

## 2019-08-18 ENCOUNTER — Ambulatory Visit (INDEPENDENT_AMBULATORY_CARE_PROVIDER_SITE_OTHER): Payer: Medicare HMO | Admitting: Legal Medicine

## 2019-08-18 ENCOUNTER — Encounter: Payer: Self-pay | Admitting: Legal Medicine

## 2019-08-18 VITALS — BP 110/60 | HR 70 | Temp 97.7°F | Resp 17 | Ht 74.0 in | Wt 142.0 lb

## 2019-08-18 DIAGNOSIS — Z8501 Personal history of malignant neoplasm of esophagus: Secondary | ICD-10-CM

## 2019-08-18 DIAGNOSIS — M5431 Sciatica, right side: Secondary | ICD-10-CM | POA: Diagnosis not present

## 2019-08-18 DIAGNOSIS — M5136 Other intervertebral disc degeneration, lumbar region: Secondary | ICD-10-CM | POA: Diagnosis not present

## 2019-08-18 DIAGNOSIS — G4733 Obstructive sleep apnea (adult) (pediatric): Secondary | ICD-10-CM | POA: Insufficient documentation

## 2019-08-18 HISTORY — DX: Personal history of malignant neoplasm of esophagus: Z85.01

## 2019-08-18 MED ORDER — TRIAMCINOLONE ACETONIDE 40 MG/ML IJ SUSP
80.0000 mg | Freq: Once | INTRAMUSCULAR | Status: AC
  Administered 2019-08-18: 80 mg via INTRAMUSCULAR

## 2019-08-18 NOTE — Progress Notes (Signed)
Acute Office Visit  Subjective:    Patient ID: [name] Shamblen., male    DOB: [birth year], 64 y.o.   MRN: [id number]  Chief Complaint  Patient presents with  . Hip Pain    Right hip pain since one month ago. It is sharp pain with radiation to his right foot    HPI Patient is in today for sciatica for one month on right side to foot.  He is using ibuprofen, no injury.  He is being treated for cancer AND COMPLETED chemotherapy. He has lost weight down to 135lbs.    Past Medical History:  Diagnosis Date  . Asthma    hx of asthma as a child  . 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 COPD (chronic obstructive pulmonary disease) (HCC)   . Esophageal cancer (HCC)   . Hx of cervical spine surgery   . Hypertension   . Lung nodules   . Obstructive sleep apnea   . Personal history of malignant neoplasm of esophagus 08/18/2019  . Pulmonary embolism [facility])     Past Surgical History:  Procedure Laterality Date  . CERVICAL SPINE SURGERY    . EUS  08/03/2011   Procedure: UPPER ENDOSCOPIC ULTRASOUND (EUS) LINEAR;  Surgeon: Rachael Fee, MD;  Location: [facility] END[facility];  Service: Endoscopy;  Laterality: N/A;  . IVC FILTER PLACEMENT ([facility] HX)  04/03/2016  . small clot in right lung  04/10/2013  . stomach pull through surgery 2013  2013      . TONSILLECTOMY      Family History  Problem Relation Age of Onset  . Hypertension Father   . COPD Father   . CAD Father   . Pancreatic cancer Sister   . Hypertension Mother     Social History   Socioeconomic History  . Marital status: Married    Spouse name: Not on file  . Number of children: Not on file  . Years of education: Not on file  . Highest education level: Not on file  Occupational History  . Not on file  Tobacco Use  . Smoking status: Former Smoker    Packs/day: 1.00    Years: 42.00    Pack years: 42.00    Types: Cigarettes    Quit date: 03/04/2014    Years since quitting: 5.4  . Smokeless tobacco: Never Used  Vaping  Use  . Vaping Use: Unknown  Substance and Sexual Activity  . Alcohol use: No  . Drug use: Not Currently    Types: Marijuana  . Sexual activity: Yes    Partners: Female    Birth control/protection: None  Other Topics Concern  . Not on file  Social History Narrative   Lives with spouse   Social Determinants of Health   Financial Resource Strain:   . Difficulty of Paying Living Expenses:   Food Insecurity:   . Worried About Programme researcher, broadcasting/film/video in the Last Year:   . Barista in the Last Year:   Transportation Needs:   . Freight forwarder (Medical):   Marland Kitchen Lack of Transportation (Non-Medical):   Physical Activity:   . Days of Exercise per Week:   . Minutes of Exercise per Session:   Stress:   . Feeling of Stress :   Social Connections:   . Frequency of Communication with Friends and Family:   . Frequency of Social Gatherings with Friends and Family:   . Attends Religious Services:   . Active Member of  Clubs or Organizations:   . Attends Banker Meetings:   Marland Kitchen Marital Status:   Intimate Partner Violence:   . Fear of Current or Ex-Partner:   . Emotionally Abused:   Marland Kitchen Physically Abused:   . Sexually Abused:     Outpatient Medications Prior to Visit  Medication Sig Dispense Refill  . ipratropium-albuterol (DUONEB) 0.5-2.5 (3) MG/3ML SOLN Take 3 mLs by nebulization 4 (four) times daily.    Marland Kitchen ipratropium-albuterol (DUONEB) 0.5-2.5 (3) MG/3ML SOLN     . omeprazole (PRILOSEC) 40 MG capsule Take 40 mg by mouth daily.    . TRELEGY ELLIPTA 100-62.5-25 MCG/INH AEPB     . LORazepam (ATIVAN) 1 MG tablet     . morphine (MS CONTIN) 30 MG 12 hr tablet     . ondansetron (ZOFRAN) 4 MG tablet     . Oxycodone HCl 10 MG TABS Take 10 mg by mouth every 6 (six) hours as needed.    . predniSONE (DELTASONE) 10 MG tablet      No facility-administered medications prior to visit.    Allergies  Allergen Reactions  . Warfarin And Related Other (See Comments)    And other  blood thinners d/t bleeding in the brain    Review of Systems  Constitutional: Negative.   HENT: Negative.   Eyes: Negative.   Respiratory: Negative.   Cardiovascular: Negative.   Genitourinary: Negative.   Musculoskeletal: Positive for back pain.  Psychiatric/Behavioral: Negative.        Objective:    Physical Exam Vitals reviewed.  Constitutional:      Appearance: Normal appearance.  HENT:     Head: Normocephalic and atraumatic.  Cardiovascular:     Rate and Rhythm: Normal rate and regular rhythm.     Pulses: Normal pulses.     Heart sounds: Normal heart sounds.  Pulmonary:     Effort: Pulmonary effort is normal.     Breath sounds: Normal breath sounds.  Musculoskeletal:     Lumbar back: Tenderness present. Positive right straight leg raise test. Negative left straight leg raise test.       Back:     Comments: Pain in right side of back radiating to right foot  Neurological:     Mental Status: He is alert.     BP 110/60   Pulse 70   Temp 97.7 F (36.5 C) (Temporal)   Resp 17   Ht 6\' 2"  (1.88 m)   Wt 142 lb (64.4 kg)   BMI 18.23 kg/m  Wt Readings from Last 3 Encounters:  08/18/19 142 lb (64.4 kg)  08/12/19 142 lb 9.6 oz (64.7 kg)  11/02/17 180 lb (81.6 kg)    Health Maintenance Due  Topic Date Due  . Hepatitis C Screening  Never done  . COVID-19 Vaccine (1) Never done  . HIV Screening  Never done  . TETANUS/TDAP  Never done    There are no preventive care reminders to display for this patient.   No results found for: TSH No results found for: WBC, HGB, HCT, MCV, PLT No results found for: NA, K, CHLORIDE, CO2, GLUCOSE, BUN, CREATININE, BILITOT, ALKPHOS, AST, ALT, PROT, ALBUMIN, CALCIUM, ANIONGAP, EGFR, GFR No results found for: CHOL No results found for: HDL No results found for: LDLCALC No results found for: TRIG No results found for: CHOLHDL No results found for: KZSW1U     Assessment & Plan:  1. Personal history of malignant neoplasm of  esophagus Patient is having new sciatica  right side, he has history of rib metastasis treated with chemotherapy. I will treat as regualr sciatica with pain medicines and steroid injection.  I ordered lumbar x-ray.  2. Obstructive sleep apnea Using CPAP consistently every night and medically benefiting from its use.    Meds ordered this encounter  Medications  . triamcinolone acetonide (KENALOG-40) injection 80 mg    Orders Placed This Encounter  Procedures  . DG Lumbar Spine Complete     Follow-up: Return in about 1 week (around 08/25/2019) for for sciatica.  An After Visit Summary was printed and given to the patient.  Brent Bulla [facility] [phone]

## 2019-08-19 ENCOUNTER — Telehealth: Payer: Self-pay

## 2019-08-19 ENCOUNTER — Other Ambulatory Visit: Payer: Self-pay | Admitting: Legal Medicine

## 2019-08-19 MED ORDER — OXYCODONE HCL 10 MG PO TABS: 10.0000 mg | ORAL_TABLET | Freq: Four times a day (QID) | ORAL | 0 refills | Status: DC | PRN

## 2019-08-19 NOTE — Telephone Encounter (Signed)
Call to patient , he states he took the last oxycodone today.

## 2019-08-19 NOTE — Telephone Encounter (Signed)
He was given a kenalog shot yesterday and he said he has oxycodone at home from cancer doctor lp

## 2019-08-19 NOTE — Telephone Encounter (Signed)
Patient called stating that it was discussed at appointment yesterday that he would be prescribed prednisone and pain medication. Patient wanting to pick up from pharmacy today if possible

## 2019-08-20 NOTE — Telephone Encounter (Signed)
Addressed by Dr. Henrene Pastor

## 2019-08-24 DIAGNOSIS — J9611 Chronic respiratory failure with hypoxia: Secondary | ICD-10-CM | POA: Diagnosis not present

## 2019-08-24 DIAGNOSIS — J449 Chronic obstructive pulmonary disease, unspecified: Secondary | ICD-10-CM | POA: Diagnosis not present

## 2019-08-25 ENCOUNTER — Ambulatory Visit (INDEPENDENT_AMBULATORY_CARE_PROVIDER_SITE_OTHER): Payer: Medicare HMO | Admitting: Legal Medicine

## 2019-08-25 ENCOUNTER — Encounter: Payer: Self-pay | Admitting: Legal Medicine

## 2019-08-25 ENCOUNTER — Other Ambulatory Visit: Payer: Self-pay

## 2019-08-25 VITALS — BP 124/70 | HR 87 | Temp 98.0°F | Resp 18 | Ht 74.0 in | Wt 142.8 lb

## 2019-08-25 DIAGNOSIS — L89301 Pressure ulcer of unspecified buttock, stage 1: Secondary | ICD-10-CM | POA: Diagnosis not present

## 2019-08-25 DIAGNOSIS — L89309 Pressure ulcer of unspecified buttock, unspecified stage: Secondary | ICD-10-CM | POA: Insufficient documentation

## 2019-08-25 DIAGNOSIS — M5431 Sciatica, right side: Secondary | ICD-10-CM

## 2019-08-25 MED ORDER — DUODERM HYDROACTIVE EX PSTE: 1.0000 "application " | PASTE | CUTANEOUS | 3 refills | Status: DC

## 2019-08-25 NOTE — Progress Notes (Signed)
Subjective:  Patient ID: [name] Ill., male    DOB: 21-Oct-1955  Age: 64 y.o. MRN: [id number]  Chief Complaint  Patient presents with  . Hip Pain    Follow up of sciatic right side.    HPI: he has sciatica and is improving.  Back x-ray shows degenerative disc. He does not want further workup.  He has 3 presure ulcer areas on buttocks over bony areas- try duoderm stage 1.   Current Outpatient Medications on File Prior to Visit  Medication Sig Dispense Refill  . ipratropium-albuterol (DUONEB) 0.5-2.5 (3) MG/3ML SOLN Take 3 mLs by nebulization 4 (four) times daily.    Marland Kitchen ipratropium-albuterol (DUONEB) 0.5-2.5 (3) MG/3ML SOLN     . LORazepam (ATIVAN) 1 MG tablet     . omeprazole (PRILOSEC) 40 MG capsule Take 40 mg by mouth daily.    . ondansetron (ZOFRAN) 4 MG tablet     . Oxycodone HCl 10 MG TABS Take 1 tablet (10 mg total) by mouth every 6 (six) hours as needed. 30 tablet 0  . predniSONE (DELTASONE) 10 MG tablet     . TRELEGY ELLIPTA 100-62.5-25 MCG/INH AEPB     . morphine (MS CONTIN) 30 MG 12 hr tablet  (Patient not taking: Reported on 08/25/2019)     No current facility-administered medications on file prior to visit.   Past Medical History:  Diagnosis Date  . Asthma    hx of asthma as a child  . 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 COPD (chronic obstructive pulmonary disease) (HCC)   . Esophageal cancer (HCC)   . Hx of cervical spine surgery   . Hypertension   . Lung nodules   . Obstructive sleep apnea   . Personal history of malignant neoplasm of esophagus 08/18/2019  . Pulmonary embolism [facility])    Past Surgical History:  Procedure Laterality Date  . CERVICAL SPINE SURGERY    . EUS  08/03/2011   Procedure: UPPER ENDOSCOPIC ULTRASOUND (EUS) LINEAR;  Surgeon: Rachael Fee, MD;  Location: [facility];  Service: Endoscopy;  Laterality: N/A;  . IVC FILTER PLACEMENT ([facility] HX)  04/03/2016  . small clot in right lung  04/10/2013  . stomach pull through surgery  2013  2013      . TONSILLECTOMY      Family History  Problem Relation Age of Onset  . Hypertension Father   . COPD Father   . CAD Father   . Pancreatic cancer Sister   . Hypertension Mother    Social History   Socioeconomic History  . Marital status: Married    Spouse name: Not on file  . Number of children: Not on file  . Years of education: Not on file  . Highest education level: Not on file  Occupational History  . Not on file  Tobacco Use  . Smoking status: Former Smoker    Packs/day: 1.00    Years: 42.00    Pack years: 42.00    Types: Cigarettes    Quit date: 03/04/2014    Years since quitting: 5.4  . Smokeless tobacco: Never Used  Vaping Use  . Vaping Use: Unknown  Substance and Sexual Activity  . Alcohol use: No  . Drug use: Not Currently    Types: Marijuana  . Sexual activity: Yes    Partners: Female    Birth control/protection: None  Other Topics Concern  . Not on file  Social History Narrative   Lives with spouse  Social Determinants of Health   Financial Resource Strain:   . Difficulty of Paying Living Expenses:   Food Insecurity:   . Worried About Programme researcher, broadcasting/film/video in the Last Year:   . Barista in the Last Year:   Transportation Needs:   . Freight forwarder (Medical):   Marland Kitchen Lack of Transportation (Non-Medical):   Physical Activity:   . Days of Exercise per Week:   . Minutes of Exercise per Session:   Stress:   . Feeling of Stress :   Social Connections:   . Frequency of Communication with Friends and Family:   . Frequency of Social Gatherings with Friends and Family:   . Attends Religious Services:   . Active Member of Clubs or Organizations:   . Attends Banker Meetings:   Marland Kitchen Marital Status:     Review of Systems  Constitutional: Negative.   HENT: Negative.   Eyes: Negative.   Respiratory: Negative.   Cardiovascular: Negative.   Gastrointestinal: Negative.   Genitourinary: Negative.   Musculoskeletal:  Negative.   Skin: Positive for wound.  Psychiatric/Behavioral: Negative.      Objective:  BP 124/70 (BP Location: Left Arm, Patient Position: Sitting)   Pulse 87   Temp 98 F (36.7 C) (Temporal)   Resp 18   Ht 6\' 2"  (1.88 m)   Wt 142 lb 12.8 oz (64.8 kg)   SpO2 94% Comment: 2 liters of Oxygen  BMI 18.33 kg/m   BP/Weight 08/25/2019 08/18/2019 08/12/2019  Systolic BP 124 110 100  Diastolic BP 70 60 62  Wt. (Lbs) 142.8 142 142.6  BMI 18.33 18.23 18.31    Physical Exam Constitutional:      Appearance: Normal appearance.  Cardiovascular:     Rate and Rhythm: Normal rate and regular rhythm.     Pulses: Normal pulses.     Heart sounds: Normal heart sounds.  Pulmonary:     Effort: Pulmonary effort is normal.     Breath sounds: Normal breath sounds.  Musculoskeletal:     Cervical back: Normal range of motion and neck supple.     Lumbar back: Spasms and tenderness present. Positive left straight leg raise test. Negative right straight leg raise test.       Back:  Skin:    Comments: #3 pressure ulcers stage 1 on buttock  Neurological:     Mental Status: He is alert.     Diabetic Foot Exam - Simple   No data filed       No results found for: WBC, HGB, HCT, PLT, GLUCOSE, CHOL, TRIG, HDL, LDLDIRECT, LDLCALC, ALT, AST, NA, K, CL, CREATININE, BUN, CO2, TSH, PSA, INR, GLUF, HGBA1C, MICROALBUR    Assessment & Plan:   1. Sciatica, right side The back pain is resolved, minimal pain  2. Pressure injury of buttock, stage 1, unspecified laterality - Hydroactive Dressings (DUODERM HYDROACTIVE) PSTE; Apply 1 application topically 2 (two) times a week.  Dispense: 30 g; Refill: 3  #3 stage 1 ulcers on buttocks, use duoderm  Meds ordered this encounter  Medications  . Hydroactive Dressings (DUODERM HYDROACTIVE) PSTE    Sig: Apply 1 application topically 2 (two) times a week.    Dispense:  30 g    Refill:  3    No orders of the defined types were placed in this encounter.     Follow-up: Return in about 2 months (around 10/25/2019) for fasting.  An After Visit Summary was printed and given  to the patient.  [facility] [phone]

## 2019-08-26 ENCOUNTER — Other Ambulatory Visit: Payer: Self-pay

## 2019-08-26 DIAGNOSIS — L89301 Pressure ulcer of unspecified buttock, stage 1: Secondary | ICD-10-CM

## 2019-08-26 MED ORDER — DUODERM HYDROACTIVE EX PSTE: 1.0000 "application " | PASTE | CUTANEOUS | 3 refills | Status: AC

## 2019-09-12 DIAGNOSIS — J449 Chronic obstructive pulmonary disease, unspecified: Secondary | ICD-10-CM | POA: Diagnosis not present

## 2019-09-12 DIAGNOSIS — J9611 Chronic respiratory failure with hypoxia: Secondary | ICD-10-CM | POA: Diagnosis not present

## 2019-09-23 DIAGNOSIS — J9611 Chronic respiratory failure with hypoxia: Secondary | ICD-10-CM | POA: Diagnosis not present

## 2019-09-23 DIAGNOSIS — J449 Chronic obstructive pulmonary disease, unspecified: Secondary | ICD-10-CM | POA: Diagnosis not present

## 2019-10-13 DIAGNOSIS — J9611 Chronic respiratory failure with hypoxia: Secondary | ICD-10-CM | POA: Diagnosis not present

## 2019-10-13 DIAGNOSIS — J449 Chronic obstructive pulmonary disease, unspecified: Secondary | ICD-10-CM | POA: Diagnosis not present

## 2019-10-17 DIAGNOSIS — R918 Other nonspecific abnormal finding of lung field: Secondary | ICD-10-CM | POA: Diagnosis not present

## 2019-10-17 DIAGNOSIS — J9611 Chronic respiratory failure with hypoxia: Secondary | ICD-10-CM | POA: Diagnosis not present

## 2019-10-17 DIAGNOSIS — G4733 Obstructive sleep apnea (adult) (pediatric): Secondary | ICD-10-CM | POA: Diagnosis not present

## 2019-10-17 DIAGNOSIS — J449 Chronic obstructive pulmonary disease, unspecified: Secondary | ICD-10-CM | POA: Diagnosis not present

## 2019-10-24 DIAGNOSIS — J9611 Chronic respiratory failure with hypoxia: Secondary | ICD-10-CM | POA: Diagnosis not present

## 2019-10-24 DIAGNOSIS — J449 Chronic obstructive pulmonary disease, unspecified: Secondary | ICD-10-CM | POA: Diagnosis not present

## 2019-10-28 ENCOUNTER — Ambulatory Visit (INDEPENDENT_AMBULATORY_CARE_PROVIDER_SITE_OTHER): Payer: Medicare HMO | Admitting: Legal Medicine

## 2019-10-28 ENCOUNTER — Other Ambulatory Visit: Payer: Self-pay

## 2019-10-28 ENCOUNTER — Encounter: Payer: Self-pay | Admitting: Legal Medicine

## 2019-10-28 VITALS — BP 124/80 | HR 87 | Temp 97.8°F | Resp 16 | Ht 74.0 in | Wt 144.8 lb

## 2019-10-28 DIAGNOSIS — R5383 Other fatigue: Secondary | ICD-10-CM | POA: Diagnosis not present

## 2019-10-28 DIAGNOSIS — C771 Secondary and unspecified malignant neoplasm of intrathoracic lymph nodes: Secondary | ICD-10-CM

## 2019-10-28 DIAGNOSIS — C159 Malignant neoplasm of esophagus, unspecified: Secondary | ICD-10-CM | POA: Diagnosis not present

## 2019-10-28 DIAGNOSIS — D500 Iron deficiency anemia secondary to blood loss (chronic): Secondary | ICD-10-CM

## 2019-10-28 DIAGNOSIS — C799 Secondary malignant neoplasm of unspecified site: Secondary | ICD-10-CM | POA: Diagnosis not present

## 2019-10-28 DIAGNOSIS — J449 Chronic obstructive pulmonary disease, unspecified: Secondary | ICD-10-CM

## 2019-10-28 DIAGNOSIS — C419 Malignant neoplasm of bone and articular cartilage, unspecified: Secondary | ICD-10-CM

## 2019-10-28 DIAGNOSIS — J0110 Acute frontal sinusitis, unspecified: Secondary | ICD-10-CM

## 2019-10-28 DIAGNOSIS — I10 Essential (primary) hypertension: Secondary | ICD-10-CM

## 2019-10-28 DIAGNOSIS — G4733 Obstructive sleep apnea (adult) (pediatric): Secondary | ICD-10-CM | POA: Diagnosis not present

## 2019-10-28 DIAGNOSIS — C779 Secondary and unspecified malignant neoplasm of lymph node, unspecified: Secondary | ICD-10-CM | POA: Insufficient documentation

## 2019-10-28 DIAGNOSIS — F5101 Primary insomnia: Secondary | ICD-10-CM

## 2019-10-28 DIAGNOSIS — Z8501 Personal history of malignant neoplasm of esophagus: Secondary | ICD-10-CM

## 2019-10-28 MED ORDER — AMOXICILLIN 875 MG PO TABS: 875.0000 mg | ORAL_TABLET | Freq: Two times a day (BID) | ORAL | 0 refills | Status: DC

## 2019-10-28 MED ORDER — ALPRAZOLAM 0.25 MG PO TABS: 0.2500 mg | ORAL_TABLET | Freq: Every day | ORAL | 2 refills | Status: AC

## 2019-10-28 NOTE — Progress Notes (Signed)
Subjective:  Patient ID: [name] Ill., male    DOB: Oct 07, 1955  Age: 64 y.o. MRN: [id number]  Chief Complaint  Patient presents with  . Hypertension  . Cancer  . COPD    HPIL chronic visit  Patient presents for follow up of hypertension.  Patient tolerating none well with side effects.  Patient was diagnosed with hypertension 2010 so has been treated for hypertension for 10 years.Patient is working on maintaining diet and exercise regimen and follows up as directed. Complication include none.  Metastatic esophageal changer to nodes in chest, last PET showed no metabolically active lesions after therapy.  He is on morphine for bone pain and PRN oxycodone  Patient presents with diagnosis of COPD.  It is not secondary to prolonged asthma.  Diagnosis 10  Treatment includes duoneb, trelegy.  The diagnosis has not been hospitalized for this diagnosis. Last na.  Patient is compliant with regular use of medicines.   Current Outpatient Medications on File Prior to Visit  Medication Sig Dispense Refill  . Hydroactive Dressings (DUODERM HYDROACTIVE) PSTE Apply 1 application topically 2 (two) times a week. 30 g 3  . ipratropium-albuterol (DUONEB) 0.5-2.5 (3) MG/3ML SOLN Take 3 mLs by nebulization 4 (four) times daily.    Marland Kitchen ipratropium-albuterol (DUONEB) 0.5-2.5 (3) MG/3ML SOLN     . LORazepam (ATIVAN) 1 MG tablet     . morphine (MS CONTIN) 30 MG 12 hr tablet     . omeprazole (PRILOSEC) 40 MG capsule Take 40 mg by mouth daily.    . ondansetron (ZOFRAN) 4 MG tablet     . Oxycodone HCl 10 MG TABS Take 1 tablet (10 mg total) by mouth every 6 (six) hours as needed. 30 tablet 0  . predniSONE (DELTASONE) 10 MG tablet     . TRELEGY ELLIPTA 100-62.5-25 MCG/INH AEPB      No current facility-administered medications on file prior to visit.   Past Medical History:  Diagnosis Date  . Asthma    hx of asthma as a child  . 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 COPD (chronic obstructive  pulmonary disease) (HCC)   . Esophageal cancer (HCC)   . Hx of cervical spine surgery   . Hypertension   . Lung nodules   . Obstructive sleep apnea   . Personal history of malignant neoplasm of esophagus 08/18/2019  . Pulmonary embolism [facility])    Past Surgical History:  Procedure Laterality Date  . CERVICAL SPINE SURGERY    . EUS  08/03/2011   Procedure: UPPER ENDOSCOPIC ULTRASOUND (EUS) LINEAR;  Surgeon: Rachael Fee, MD;  Location: [facility];  Service: Endoscopy;  Laterality: N/A;  . IVC FILTER PLACEMENT ([facility] HX)  04/03/2016  . small clot in right lung  04/10/2013  . stomach pull through surgery 2013  2013      . TONSILLECTOMY      Family History  Problem Relation Age of Onset  . Hypertension Father   . COPD Father   . CAD Father   . Pancreatic cancer Sister   . Hypertension Mother    Social History   Socioeconomic History  . Marital status: Married    Spouse name: Not on file  . Number of children: Not on file  . Years of education: Not on file  . Highest education level: Not on file  Occupational History  . Not on file  Tobacco Use  . Smoking status: Former Smoker    Packs/day: 1.00  Years: 42.00    Pack years: 42.00    Types: Cigarettes    Quit date: 03/04/2014    Years since quitting: 5.6  . Smokeless tobacco: Never Used  Vaping Use  . Vaping Use: Unknown  Substance and Sexual Activity  . Alcohol use: No  . Drug use: Not Currently    Types: Marijuana  . Sexual activity: Yes    Partners: Female    Birth control/protection: None  Other Topics Concern  . Not on file  Social History Narrative   Lives with spouse   Social Determinants of Health   Financial Resource Strain:   . Difficulty of Paying Living Expenses: Not on file  Food Insecurity:   . Worried About Programme researcher, broadcasting/film/video in the Last Year: Not on file  . Ran Out of Food in the Last Year: Not on file  Transportation Needs:   . Lack of Transportation (Medical): Not on file  . Lack of  Transportation (Non-Medical): Not on file  Physical Activity:   . Days of Exercise per Week: Not on file  . Minutes of Exercise per Session: Not on file  Stress:   . Feeling of Stress : Not on file  Social Connections:   . Frequency of Communication with Friends and Family: Not on file  . Frequency of Social Gatherings with Friends and Family: Not on file  . Attends Religious Services: Not on file  . Active Member of Clubs or Organizations: Not on file  . Attends Banker Meetings: Not on file  . Marital Status: Not on file    Review of Systems  Constitutional: Positive for appetite change.  HENT: Negative.   Eyes: Negative.   Respiratory: Negative for apnea, cough, shortness of breath and wheezing.   Cardiovascular: Negative for chest pain, palpitations and leg swelling.  Gastrointestinal: Negative.   Endocrine: Negative.   Genitourinary: Negative.   Musculoskeletal: Negative.   Neurological: Negative.   Psychiatric/Behavioral: Negative.      Objective:  BP 124/80   Pulse 87   Temp 97.8 F (36.6 C)   Resp 16   Ht 6\' 2"  (1.88 m)   Wt 144 lb 12.8 oz (65.7 kg)   SpO2 94% Comment: 2L of oxygen  BMI 18.59 kg/m   BP/Weight 10/28/2019 08/25/2019 08/18/2019  Systolic BP 124 124 110  Diastolic BP 80 70 60  Wt. (Lbs) 144.8 142.8 142  BMI 18.59 18.33 18.23    Physical Exam Vitals reviewed.  Constitutional:      Comments: On 2L/O2  HENT:     Head: Normocephalic.     Right Ear: Tympanic membrane, ear canal and external ear normal.     Left Ear: Tympanic membrane, ear canal and external ear normal.     Nose:     Comments: Sinus pain Eyes:     Extraocular Movements: Extraocular movements intact.     Conjunctiva/sclera: Conjunctivae normal.     Pupils: Pupils are equal, round, and reactive to light.  Cardiovascular:     Rate and Rhythm: Normal rate and regular rhythm.  Pulmonary:     Effort: Pulmonary effort is normal.     Breath sounds: Normal breath  sounds.     Comments: Left chest wall pain Abdominal:     General: Abdomen is flat. Bowel sounds are normal.  Musculoskeletal:        General: Normal range of motion.  Skin:    General: Skin is warm.     Capillary Refill:  Capillary refill takes less than 2 seconds.  Neurological:     General: No focal deficit present.     Mental Status: He is alert and oriented to person, place, and time.  Psychiatric:        Thought Content: Thought content normal.        Judgment: Judgment normal.       No results found for: WBC, HGB, HCT, PLT, GLUCOSE, CHOL, TRIG, HDL, LDLDIRECT, LDLCALC, ALT, AST, NA, K, CL, CREATININE, BUN, CO2, TSH, PSA, INR, GLUF, HGBA1C, MICROALBUR    Assessment & Plan:  Diagnoses and all orders for this visit: Chronic obstructive pulmonary disease, unspecified COPD type (HCC) An individualize plan was formulated for care of COPD.  Treatment is evidence based.  She will continue on inhalers, avoid smoking and smoke.  Regular exercise with help with dyspnea. Routine follow ups and medication compliance is needed.  Essential hypertension -     CBC with Differential/Platelet -     Comprehensive metabolic panel An individual hypertension care plan was established and reinforced today.  The patient's status was assessed using clinical findings on exam and labs or diagnostic tests. The patient's success at meeting treatment goals on disease specific evidence-based guidelines and found to be well controlled. SELF MANAGEMENT: The patient and I together assessed ways to personally work towards obtaining the recommended goals. RECOMMENDATIONS: avoid decongestants found in common cold remedies, decrease consumption of alcohol, perform routine monitoring of BP with home BP cuff, exercise, reduction of dietary salt, take medicines as prescribed, try not to miss doses and quit smoking.  Regular exercise and maintaining a healthy weight is needed.  Stress reduction may help. A CLINICAL  SUMMARY including written plan identify barriers to care unique to individual due to social or financial issues.  We attempt to mutually creat solutions for individual and family understanding.  Obstructive sleep apnea AN INDIVIDUAL CARE PLAN for OSA was established and reinforced today.  The patient's status was assessed using clinical findings on exam, labs, and other diagnostic testing. Patient's success at meeting treatment goals based on disease specific evidence-bassed guidelines and found to be in good control. RECOMMENDATIONS include maintaining present medicines and treatment. He is compliant with CPAP  Blood loss anemia He CBC is improving  Hypomagnesemia Patient is normal with his magnesium now  Esophageal carcinoma [facility]) Patient has recurrent esophageal carcinoma and has finished his chemotherapy.  Last PET scan good.  Metastasis from malignant neoplasm of bone [facility]) Patient has mets to right ribs that gives him pain.  The rib was irradiated.  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 AN INDIVIDUAL CARE PLAN for insomnia was established and reinforced today.  The patient's status was assessed using clinical findings on exam, labs, and other diagnostic testing. Patient's success at meeting treatment goals based on disease specific evidence-bassed guidelines and found to be in fair control. RECOMMENDATIONS include starting new medicine medicines and treatment.  Acute non-recurrent frontal sinusitis -     amoxicillin (AMOXIL) 875 MG tablet; Take 1 tablet (875 mg total) by mouth 2 (two) times daily. Patient has sinuitis and with will treat with amoxicillin  Fatigue, unspecified type -     TSH Patient has multiple reasons to be fatigued.  We will check his TSH after irradiation and  chemotherapy  Meds ordered this encounter  Medications  . ALPRAZolam (XANAX) 0.25 MG tablet    Sig: Take 1 tablet (0.25 mg total) by mouth at bedtime.    Dispense:  30 tablet    Refill:  2  . amoxicillin (AMOXIL) 875 MG tablet    Sig: Take 1 tablet (875 mg total) by mouth 2 (two) times daily.    Dispense:  20 tablet    Refill:  0    Orders Placed This Encounter  Procedures  . CBC with Differential/Platelet  . Comprehensive metabolic panel  . TSH     Follow-up: Return in about 3 months (around 01/27/2020) for fasting.  An After Visit Summary was printed and given to the patient.  [facility] [phone]

## 2019-10-29 LAB — CBC WITH DIFFERENTIAL/PLATELET
Basophils Absolute: 0 10*3/uL (ref 0.0–0.2)
Basos: 0 %
EOS (ABSOLUTE): 0.1 10*3/uL (ref 0.0–0.4)
Eos: 1 %
Hematocrit: 33.4 % — ABNORMAL LOW (ref 37.5–51.0)
Hemoglobin: 10.6 g/dL — ABNORMAL LOW (ref 13.0–17.7)
Immature Grans (Abs): 0 10*3/uL (ref 0.0–0.1)
Immature Granulocytes: 0 %
Lymphocytes Absolute: 0.5 10*3/uL — ABNORMAL LOW (ref 0.7–3.1)
Lymphs: 5 %
MCH: 28.1 pg (ref 26.6–33.0)
MCHC: 31.7 g/dL (ref 31.5–35.7)
MCV: 89 fL (ref 79–97)
Monocytes Absolute: 0.6 10*3/uL (ref 0.1–0.9)
Monocytes: 6 %
Neutrophils Absolute: 8.8 10*3/uL — ABNORMAL HIGH (ref 1.4–7.0)
Neutrophils: 88 %
Platelets: 210 10*3/uL (ref 150–450)
RBC: 3.77 x10E6/uL — ABNORMAL LOW (ref 4.14–5.80)
RDW: 13.6 % (ref 11.6–15.4)
WBC: 10 10*3/uL (ref 3.4–10.8)

## 2019-10-29 LAB — COMPREHENSIVE METABOLIC PANEL
ALT: 14 IU/L (ref 0–44)
AST: 16 IU/L (ref 0–40)
Albumin/Globulin Ratio: 1 — ABNORMAL LOW (ref 1.2–2.2)
Albumin: 3.6 g/dL — ABNORMAL LOW (ref 3.8–4.8)
Alkaline Phosphatase: 84 IU/L (ref 48–121)
BUN/Creatinine Ratio: 13 (ref 10–24)
BUN: 12 mg/dL (ref 8–27)
Bilirubin Total: <0.2 mg/dL (ref 0.0–1.2)
CO2: 27 mmol/L (ref 20–29)
Calcium: 9.2 mg/dL (ref 8.6–10.2)
Chloride: 105 mmol/L (ref 96–106)
Creatinine, Ser: 0.95 mg/dL (ref 0.76–1.27)
GFR calc Af Amer: 99 mL/min/{1.73_m2} (ref >59)
GFR calc non Af Amer: 85 mL/min/{1.73_m2} (ref >59)
Globulin, Total: 3.5 g/dL (ref 1.5–4.5)
Glucose: 98 mg/dL (ref 65–99)
Potassium: 3.4 mmol/L — ABNORMAL LOW (ref 3.5–5.2)
Sodium: 146 mmol/L — ABNORMAL HIGH (ref 134–144)
Total Protein: 7.1 g/dL (ref 6.0–8.5)

## 2019-10-29 LAB — TSH: TSH: 2.44 u[IU]/mL (ref 0.450–4.500)

## 2019-10-29 NOTE — Progress Notes (Signed)
Still anemic. Potassium low 3.4, liver tests good, TSH 2.44 normal, repeat potassium in one week lp

## 2019-11-04 ENCOUNTER — Other Ambulatory Visit: Payer: Medicare HMO

## 2019-11-04 ENCOUNTER — Other Ambulatory Visit: Payer: Self-pay

## 2019-11-04 DIAGNOSIS — E876 Hypokalemia: Secondary | ICD-10-CM

## 2019-11-04 LAB — COMPREHENSIVE METABOLIC PANEL
ALT: 14 IU/L (ref 0–44)
AST: 20 IU/L (ref 0–40)
Albumin/Globulin Ratio: 1.1 — ABNORMAL LOW (ref 1.2–2.2)
Albumin: 3.6 g/dL — ABNORMAL LOW (ref 3.8–4.8)
Alkaline Phosphatase: 77 IU/L (ref 48–121)
BUN/Creatinine Ratio: 9 — ABNORMAL LOW (ref 10–24)
BUN: 9 mg/dL (ref 8–27)
Bilirubin Total: 0.4 mg/dL (ref 0.0–1.2)
CO2: 27 mmol/L (ref 20–29)
Calcium: 9.2 mg/dL (ref 8.6–10.2)
Chloride: 99 mmol/L (ref 96–106)
Creatinine, Ser: 0.96 mg/dL (ref 0.76–1.27)
GFR calc Af Amer: 98 mL/min/{1.73_m2} (ref >59)
GFR calc non Af Amer: 84 mL/min/{1.73_m2} (ref >59)
Globulin, Total: 3.4 g/dL (ref 1.5–4.5)
Glucose: 107 mg/dL — ABNORMAL HIGH (ref 65–99)
Potassium: 4.1 mmol/L (ref 3.5–5.2)
Sodium: 140 mmol/L (ref 134–144)
Total Protein: 7 g/dL (ref 6.0–8.5)

## 2019-11-05 NOTE — Progress Notes (Signed)
Potassium 4.1, kidney tests normal, liver tests normal lp

## 2019-11-13 DIAGNOSIS — J9611 Chronic respiratory failure with hypoxia: Secondary | ICD-10-CM | POA: Diagnosis not present

## 2019-11-13 DIAGNOSIS — J449 Chronic obstructive pulmonary disease, unspecified: Secondary | ICD-10-CM | POA: Diagnosis not present

## 2019-11-24 DIAGNOSIS — J439 Emphysema, unspecified: Secondary | ICD-10-CM | POA: Diagnosis not present

## 2019-11-24 DIAGNOSIS — S2232XA Fracture of one rib, left side, initial encounter for closed fracture: Secondary | ICD-10-CM | POA: Diagnosis not present

## 2019-11-24 DIAGNOSIS — C3412 Malignant neoplasm of upper lobe, left bronchus or lung: Secondary | ICD-10-CM | POA: Diagnosis not present

## 2019-11-24 DIAGNOSIS — I288 Other diseases of pulmonary vessels: Secondary | ICD-10-CM | POA: Diagnosis not present

## 2019-11-24 DIAGNOSIS — I7789 Other specified disorders of arteries and arterioles: Secondary | ICD-10-CM | POA: Diagnosis not present

## 2019-11-24 DIAGNOSIS — J181 Lobar pneumonia, unspecified organism: Secondary | ICD-10-CM | POA: Diagnosis not present

## 2019-11-24 DIAGNOSIS — I700 Atherosclerosis of aorta: Secondary | ICD-10-CM | POA: Diagnosis not present

## 2019-11-24 DIAGNOSIS — J449 Chronic obstructive pulmonary disease, unspecified: Secondary | ICD-10-CM | POA: Diagnosis not present

## 2019-11-24 DIAGNOSIS — Z8501 Personal history of malignant neoplasm of esophagus: Secondary | ICD-10-CM | POA: Diagnosis not present

## 2019-11-24 DIAGNOSIS — J9611 Chronic respiratory failure with hypoxia: Secondary | ICD-10-CM | POA: Diagnosis not present

## 2019-11-25 DIAGNOSIS — J439 Emphysema, unspecified: Secondary | ICD-10-CM | POA: Diagnosis not present

## 2019-11-25 DIAGNOSIS — R918 Other nonspecific abnormal finding of lung field: Secondary | ICD-10-CM | POA: Diagnosis not present

## 2019-11-25 DIAGNOSIS — Z8501 Personal history of malignant neoplasm of esophagus: Secondary | ICD-10-CM | POA: Diagnosis not present

## 2019-11-25 DIAGNOSIS — C3412 Malignant neoplasm of upper lobe, left bronchus or lung: Secondary | ICD-10-CM | POA: Diagnosis not present

## 2019-12-10 ENCOUNTER — Encounter: Payer: Self-pay | Admitting: Legal Medicine

## 2019-12-10 ENCOUNTER — Ambulatory Visit (INDEPENDENT_AMBULATORY_CARE_PROVIDER_SITE_OTHER): Payer: Medicare HMO | Admitting: Legal Medicine

## 2019-12-10 ENCOUNTER — Other Ambulatory Visit: Payer: Self-pay

## 2019-12-10 VITALS — BP 80/60 | HR 101 | Temp 97.7°F | Resp 17 | Ht 74.0 in | Wt 135.0 lb

## 2019-12-10 DIAGNOSIS — G629 Polyneuropathy, unspecified: Secondary | ICD-10-CM | POA: Insufficient documentation

## 2019-12-10 DIAGNOSIS — G620 Drug-induced polyneuropathy: Secondary | ICD-10-CM

## 2019-12-10 DIAGNOSIS — Z9189 Other specified personal risk factors, not elsewhere classified: Secondary | ICD-10-CM | POA: Diagnosis not present

## 2019-12-10 DIAGNOSIS — J189 Pneumonia, unspecified organism: Secondary | ICD-10-CM | POA: Insufficient documentation

## 2019-12-10 LAB — POC COVID19 BINAXNOW: SARS Coronavirus 2 Ag: NEGATIVE

## 2019-12-10 MED ORDER — LEVOFLOXACIN 500 MG PO TABS: 500.0000 mg | ORAL_TABLET | Freq: Every day | ORAL | 0 refills | Status: DC

## 2019-12-10 MED ORDER — GABAPENTIN 300 MG PO CAPS: 300.0000 mg | ORAL_CAPSULE | Freq: Three times a day (TID) | ORAL | 3 refills | Status: DC

## 2019-12-10 NOTE — Progress Notes (Signed)
Acute Office Visit  Subjective:    Patient ID: [name] [name]., male    DOB: [birth year], 64 y.o.   MRN: [id number]  Chief Complaint  Patient presents with  . Fever  . Generalized Body Aches    Since 5 days ago  . Numbness    HPI Patient is in today for cough.  patient ha been getting sick 2 weeks ago ith congestion and dry cough.  He saw Oncology who put him on antibiotic due to shadow on CT lung scan.  No help. He has one temp 101 degree, chills all the time.  He is on chemotherapy. He as on zithromx 500mg  and prednisone pack. No help.using nebulizer at home. He is on continueous O2 2L/min  Past Medical History:  Diagnosis Date  . Asthma    hx of asthma as a child  . 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 COPD (chronic obstructive pulmonary disease) (HCC)   . Esophageal cancer (HCC)   . Hx of cervical spine surgery   . Hypertension   . Lung nodules   . Obstructive sleep apnea   . Personal history of malignant neoplasm of esophagus 08/18/2019  . Pulmonary embolism [facility])     Past Surgical History:  Procedure Laterality Date  . CERVICAL SPINE SURGERY    . EUS  08/03/2011   Procedure: UPPER ENDOSCOPIC ULTRASOUND (EUS) LINEAR;  Surgeon: Rachael Fee, MD;  Location: [facility];  Service: Endoscopy;  Laterality: N/A;  . IVC FILTER PLACEMENT ([facility] HX)  04/03/2016  . small clot in right lung  04/10/2013  . stomach pull through surgery 2013  2013      . TONSILLECTOMY      Family History  Problem Relation Age of Onset  . Hypertension Father   . COPD Father   . CAD Father   . Pancreatic cancer Sister   . Hypertension Mother     Social History   Socioeconomic History  . Marital status: Married    Spouse name: Not on file  . Number of children: Not on file  . Years of education: Not on file  . Highest education level: Not on file  Occupational History  . Not on file  Tobacco Use  . Smoking status: Former Smoker    Packs/day: 1.00    Years: 42.00     Pack years: 42.00    Types: Cigarettes    Quit date: 03/04/2014    Years since quitting: 5.7  . Smokeless tobacco: Never Used  Vaping Use  . Vaping Use: Unknown  Substance and Sexual Activity  . Alcohol use: No  . Drug use: Not Currently    Types: Marijuana  . Sexual activity: Yes    Partners: Female    Birth control/protection: None  Other Topics Concern  . Not on file  Social History Narrative   Lives with spouse   Social Determinants of Health   Financial Resource Strain:   . Difficulty of Paying Living Expenses: Not on file  Food Insecurity:   . Worried About Programme researcher, broadcasting/film/video in the Last Year: Not on file  . Ran Out of Food in the Last Year: Not on file  Transportation Needs:   . Lack of Transportation (Medical): Not on file  . Lack of Transportation (Non-Medical): Not on file  Physical Activity:   . Days of Exercise per Week: Not on file  . Minutes of Exercise per Session: Not on file  Stress:   .  Feeling of Stress : Not on file  Social Connections:   . Frequency of Communication with Friends and Family: Not on file  . Frequency of Social Gatherings with Friends and Family: Not on file  . Attends Religious Services: Not on file  . Active Member of Clubs or Organizations: Not on file  . Attends Banker Meetings: Not on file  . Marital Status: Not on file  Intimate Partner Violence:   . Fear of Current or Ex-Partner: Not on file  . Emotionally Abused: Not on file  . Physically Abused: Not on file  . Sexually Abused: Not on file    Outpatient Medications Prior to Visit  Medication Sig Dispense Refill  . ALPRAZolam (XANAX) 0.25 MG tablet Take 1 tablet (0.25 mg total) by mouth at bedtime. 30 tablet 2  . Hydroactive Dressings (DUODERM HYDROACTIVE) PSTE Apply 1 application topically 2 (two) times a week. 30 g 3  . ipratropium-albuterol (DUONEB) 0.5-2.5 (3) MG/3ML SOLN Take 3 mLs by nebulization 4 (four) times daily.    Marland Kitchen ipratropium-albuterol (DUONEB)  0.5-2.5 (3) MG/3ML SOLN     . LORazepam (ATIVAN) 1 MG tablet     . morphine (MS CONTIN) 30 MG 12 hr tablet     . omeprazole (PRILOSEC) 40 MG capsule Take 40 mg by mouth daily.    . ondansetron (ZOFRAN) 4 MG tablet     . Oxycodone HCl 10 MG TABS Take 1 tablet (10 mg total) by mouth every 6 (six) hours as needed. 30 tablet 0  . predniSONE (DELTASONE) 10 MG tablet     . TRELEGY ELLIPTA 100-62.5-25 MCG/INH AEPB     . amoxicillin (AMOXIL) 875 MG tablet Take 1 tablet (875 mg total) by mouth 2 (two) times daily. 20 tablet 0   No facility-administered medications prior to visit.    Allergies  Allergen Reactions  . Warfarin And Related Other (See Comments)    And other blood thinners d/t bleeding in the brain    Review of Systems  Constitutional: Positive for activity change, chills, fatigue and fever. Negative for appetite change.  HENT: Positive for congestion, rhinorrhea and sinus pain.   Eyes: Negative.   Respiratory: Positive for cough (white phlegm) and shortness of breath.   Cardiovascular: Negative for chest pain, palpitations and leg swelling.  Gastrointestinal: Negative.   Endocrine: Negative.   Genitourinary: Negative.   Musculoskeletal: Negative.   Neurological: Negative.   Psychiatric/Behavioral: Negative.        Objective:    Physical Exam Vitals reviewed.  Constitutional:      Appearance: Normal appearance.  HENT:     Right Ear: Tympanic membrane normal.     Left Ear: Tympanic membrane normal.     Nose: Nose normal.  Eyes:     Extraocular Movements: Extraocular movements intact.     Conjunctiva/sclera: Conjunctivae normal.     Pupils: Pupils are equal, round, and reactive to light.  Cardiovascular:     Rate and Rhythm: Normal rate and regular rhythm.     Pulses: Normal pulses.     Heart sounds: Normal heart sounds.  Pulmonary:     Effort: Pulmonary effort is normal.     Breath sounds: Rhonchi (diffuse) present.  Abdominal:     General: Abdomen is flat.  Bowel sounds are normal.     Palpations: Abdomen is soft.  Musculoskeletal:     Cervical back: Normal range of motion and neck supple.  Skin:    General: Skin is dry.  Neurological:     General: No focal deficit present.     Mental Status: He is alert.     BP (!) 80/60   Pulse (!) 101   Temp 97.7 F (36.5 C)   Resp 17   Ht 6\' 2"  (1.88 m)   Wt 135 lb (61.2 kg)   SpO2 96% Comment: 2 liters of oxygen  BMI 17.33 kg/m  Wt Readings from Last 3 Encounters:  12/10/19 135 lb (61.2 kg)  10/28/19 144 lb 12.8 oz (65.7 kg)  08/25/19 142 lb 12.8 oz (64.8 kg)    Health Maintenance Due  Topic Date Due  . Hepatitis C Screening  Never done  . COVID-19 Vaccine (1) Never done  . HIV Screening  Never done  . TETANUS/TDAP  Never done  . INFLUENZA VACCINE  09/28/2019    There are no preventive care reminders to display for this patient.   Lab Results  Component Value Date   TSH 2.440 10/28/2019   Lab Results  Component Value Date   WBC 10.0 10/28/2019   HGB 10.6 (L) 10/28/2019   HCT 33.4 (L) 10/28/2019   MCV 89 10/28/2019   PLT 210 10/28/2019   Lab Results  Component Value Date   NA 140 11/04/2019   K 4.1 11/04/2019   CO2 27 11/04/2019   GLUCOSE 107 (H) 11/04/2019   BUN 9 11/04/2019   CREATININE 0.96 11/04/2019   BILITOT 0.4 11/04/2019   ALKPHOS 77 11/04/2019   AST 20 11/04/2019   ALT 14 11/04/2019   PROT 7.0 11/04/2019   ALBUMIN 3.6 (L) 11/04/2019   CALCIUM 9.2 11/04/2019   No results found for: CHOL No results found for: HDL No results found for: LDLCALC No results found for: TRIG No results found for: CHOLHDL No results found for: [id number]     Assessment & Plan:  1. At increased risk of exposure to COVID-19 virus - POC COVID-19 Negative for Covid  2. Pneumonia of both lower lobes due to infectious organism - levofloxacin (LEVAQUIN) 500 MG tablet; Take 1 tablet (500 mg total) by mouth daily.  Dispense: 7 tablet; Refill: 0 Increased cough and dyspnea, start  on levaquin and [place] nebulizer at home.  3. Drug-induced polyneuropathy (HCC) - gabapentin (NEURONTIN) 300 MG capsule; Take 1 capsule (300 mg total) by mouth 3 (three) times daily.  Dispense: 90 capsule; Refill: 3 Patient has neuropathy from chemotherapy, start on neurontin    Meds ordered this encounter  Medications  . levofloxacin (LEVAQUIN) 500 MG tablet    Sig: Take 1 tablet (500 mg total) by mouth daily.    Dispense:  7 tablet    Refill:  0  . gabapentin (NEURONTIN) 300 MG capsule    Sig: Take 1 capsule (300 mg total) by mouth 3 (three) times daily.    Dispense:  90 capsule    Refill:  3    Orders Placed This Encounter  Procedures  . POC COVID-19     Follow-up: Return in about 1 week (around 12/17/2019), or if not iproved.  An After Visit Summary was printed and given to the patient.  Brent Bulla [facility] [phone]

## 2019-12-13 DIAGNOSIS — J9611 Chronic respiratory failure with hypoxia: Secondary | ICD-10-CM | POA: Diagnosis not present

## 2019-12-13 DIAGNOSIS — J449 Chronic obstructive pulmonary disease, unspecified: Secondary | ICD-10-CM | POA: Diagnosis not present

## 2019-12-24 DIAGNOSIS — J449 Chronic obstructive pulmonary disease, unspecified: Secondary | ICD-10-CM | POA: Diagnosis not present

## 2019-12-24 DIAGNOSIS — J9611 Chronic respiratory failure with hypoxia: Secondary | ICD-10-CM | POA: Diagnosis not present

## 2019-12-29 ENCOUNTER — Ambulatory Visit (INDEPENDENT_AMBULATORY_CARE_PROVIDER_SITE_OTHER): Payer: Medicare HMO | Admitting: Legal Medicine

## 2019-12-29 ENCOUNTER — Encounter: Payer: Self-pay | Admitting: Legal Medicine

## 2019-12-29 ENCOUNTER — Other Ambulatory Visit: Payer: Self-pay

## 2019-12-29 VITALS — BP 110/60 | HR 72 | Temp 97.6°F | Resp 16 | Ht 74.0 in | Wt 142.0 lb

## 2019-12-29 DIAGNOSIS — G620 Drug-induced polyneuropathy: Secondary | ICD-10-CM | POA: Diagnosis not present

## 2019-12-29 DIAGNOSIS — R918 Other nonspecific abnormal finding of lung field: Secondary | ICD-10-CM | POA: Diagnosis not present

## 2019-12-29 DIAGNOSIS — J9611 Chronic respiratory failure with hypoxia: Secondary | ICD-10-CM | POA: Diagnosis not present

## 2019-12-29 DIAGNOSIS — C159 Malignant neoplasm of esophagus, unspecified: Secondary | ICD-10-CM | POA: Diagnosis not present

## 2019-12-29 DIAGNOSIS — G4733 Obstructive sleep apnea (adult) (pediatric): Secondary | ICD-10-CM | POA: Diagnosis not present

## 2019-12-29 DIAGNOSIS — J449 Chronic obstructive pulmonary disease, unspecified: Secondary | ICD-10-CM | POA: Diagnosis not present

## 2019-12-29 NOTE — Progress Notes (Signed)
Established Patient Office Visit  Subjective:  Patient ID: [name] Viegas., male    DOB: [birth year]  Age: 64 y.o. MRN: [id number]  CC:  Chief Complaint  Patient presents with  . Hypertension    pt is not fasting,     HPI [name] Ill. presents for Patient unable to tolerate gabapentin due to somnolence .   We stopped.  He is breathing better.  But feels cold all the time after weight loss and chemotherapy  Patient has esophageal cancer with chest wall bone mets that are healing and he is of chemotherapy, he is swallowing fair.  Past Medical History:  Diagnosis Date  . Asthma    hx of asthma as a child  . 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 COPD (chronic obstructive pulmonary disease) (HCC)   . Esophageal cancer (HCC)   . Hx of cervical spine surgery   . Hypertension   . Lung nodules   . Obstructive sleep apnea   . Personal history of malignant neoplasm of esophagus 08/18/2019  . Pulmonary embolism [facility])     Past Surgical History:  Procedure Laterality Date  . CERVICAL SPINE SURGERY    . EUS  08/03/2011   Procedure: UPPER ENDOSCOPIC ULTRASOUND (EUS) LINEAR;  Surgeon: Rachael Fee, MD;  Location: [facility];  Service: Endoscopy;  Laterality: N/A;  . IVC FILTER PLACEMENT ([facility] HX)  04/03/2016  . small clot in right lung  04/10/2013  . stomach pull through surgery 2013  2013      . TONSILLECTOMY      Family History  Problem Relation Age of Onset  . Hypertension Father   . COPD Father   . CAD Father   . Pancreatic cancer Sister   . Hypertension Mother     Social History   Socioeconomic History  . Marital status: Married    Spouse name: Not on file  . Number of children: Not on file  . Years of education: Not on file  . Highest education level: Not on file  Occupational History  . Not on file  Tobacco Use  . Smoking status: Former Smoker    Packs/day: 1.00    Years: 42.00    Pack years: 42.00    Types: Cigarettes    Quit date: 03/04/2014     Years since quitting: 5.8  . Smokeless tobacco: Never Used  Vaping Use  . Vaping Use: Unknown  Substance and Sexual Activity  . Alcohol use: No  . Drug use: Not Currently    Types: Marijuana  . Sexual activity: Yes    Partners: Female    Birth control/protection: None  Other Topics Concern  . Not on file  Social History Narrative   Lives with spouse   Social Determinants of Health   Financial Resource Strain:   . Difficulty of Paying Living Expenses: Not on file  Food Insecurity:   . Worried About Programme researcher, broadcasting/film/video in the Last Year: Not on file  . Ran Out of Food in the Last Year: Not on file  Transportation Needs:   . Lack of Transportation (Medical): Not on file  . Lack of Transportation (Non-Medical): Not on file  Physical Activity:   . Days of Exercise per Week: Not on file  . Minutes of Exercise per Session: Not on file  Stress:   . Feeling of Stress : Not on file  Social Connections:   . Frequency of Communication with Friends and Family: Not on  file  . Frequency of Social Gatherings with Friends and Family: Not on file  . Attends Religious Services: Not on file  . Active Member of Clubs or Organizations: Not on file  . Attends Banker Meetings: Not on file  . Marital Status: Not on file  Intimate Partner Violence:   . Fear of Current or Ex-Partner: Not on file  . Emotionally Abused: Not on file  . Physically Abused: Not on file  . Sexually Abused: Not on file    Outpatient Medications Prior to Visit  Medication Sig Dispense Refill  . ALPRAZolam (XANAX) 0.25 MG tablet Take 1 tablet (0.25 mg total) by mouth at bedtime. 30 tablet 2  . Hydroactive Dressings (DUODERM HYDROACTIVE) PSTE Apply 1 application topically 2 (two) times a week. 30 g 3  . ipratropium-albuterol (DUONEB) 0.5-2.5 (3) MG/3ML SOLN Take 3 mLs by nebulization 4 (four) times daily.    Marland Kitchen ipratropium-albuterol (DUONEB) 0.5-2.5 (3) MG/3ML SOLN     . LORazepam (ATIVAN) 1 MG tablet      . morphine (MS CONTIN) 30 MG 12 hr tablet     . omeprazole (PRILOSEC) 40 MG capsule Take 40 mg by mouth daily.    . Oxycodone HCl 10 MG TABS Take 1 tablet (10 mg total) by mouth every 6 (six) hours as needed. 30 tablet 0  . TRELEGY ELLIPTA 100-62.5-25 MCG/INH AEPB     . gabapentin (NEURONTIN) 300 MG capsule Take 1 capsule (300 mg total) by mouth 3 (three) times daily. 90 capsule 3  . levofloxacin (LEVAQUIN) 500 MG tablet Take 1 tablet (500 mg total) by mouth daily. (Patient not taking: Reported on 12/29/2019) 7 tablet 0  . ondansetron (ZOFRAN) 4 MG tablet  (Patient not taking: Reported on 12/29/2019)    . predniSONE (DELTASONE) 10 MG tablet  (Patient not taking: Reported on 12/29/2019)     No facility-administered medications prior to visit.    Allergies  Allergen Reactions  . Warfarin And Related Other (See Comments)    And other blood thinners d/t bleeding in the brain    ROS Review of Systems  Constitutional: Positive for chills. Negative for activity change and appetite change.  HENT: Negative.   Eyes: Negative.   Respiratory: Negative.   Cardiovascular: Negative.   Gastrointestinal: Negative.   Endocrine: Negative.   Genitourinary: Negative.   Neurological: Negative.   Psychiatric/Behavioral: Negative.       Objective:    Physical Exam Vitals reviewed.  Constitutional:      Appearance: Normal appearance.  HENT:     Head: Atraumatic.     Right Ear: Tympanic membrane and ear canal normal.     Left Ear: Tympanic membrane, ear canal and external ear normal.  Cardiovascular:     Rate and Rhythm: Normal rate and regular rhythm.     Pulses: Normal pulses.  Pulmonary:     Effort: Pulmonary effort is normal.     Breath sounds: Normal breath sounds.  Musculoskeletal:        General: Normal range of motion.     Cervical back: Normal range of motion and neck supple.     Comments: Less radicular pain  Skin:    General: Skin is warm and dry.     Capillary Refill:  Capillary refill takes less than 2 seconds.  Neurological:     General: No focal deficit present.     Mental Status: He is alert and oriented to person, place, and time.     BP  110/60   Pulse 72   Temp 97.6 F (36.4 C)   Resp 16   Ht 6\' 2"  (1.88 m)   Wt 142 lb (64.4 kg)   SpO2 92%   BMI 18.23 kg/m  Wt Readings from Last 3 Encounters:  12/29/19 142 lb (64.4 kg)  12/10/19 135 lb (61.2 kg)  10/28/19 144 lb 12.8 oz (65.7 kg)     Health Maintenance Due  Topic Date Due  . Hepatitis C Screening  Never done  . COVID-19 Vaccine (1) Never done  . HIV Screening  Never done  . TETANUS/TDAP  Never done  . INFLUENZA VACCINE  09/28/2019    There are no preventive care reminders to display for this patient.  Lab Results  Component Value Date   TSH 2.440 10/28/2019   Lab Results  Component Value Date   WBC 10.0 10/28/2019   HGB 10.6 (L) 10/28/2019   HCT 33.4 (L) 10/28/2019   MCV 89 10/28/2019   PLT 210 10/28/2019   Lab Results  Component Value Date   NA 140 11/04/2019   K 4.1 11/04/2019   CO2 27 11/04/2019   GLUCOSE 107 (H) 11/04/2019   BUN 9 11/04/2019   CREATININE 0.96 11/04/2019   BILITOT 0.4 11/04/2019   ALKPHOS 77 11/04/2019   AST 20 11/04/2019   ALT 14 11/04/2019   PROT 7.0 11/04/2019   ALBUMIN 3.6 (L) 11/04/2019   CALCIUM 9.2 11/04/2019   No results found for: CHOL No results found for: HDL No results found for: LDLCALC No results found for: TRIG No results found for: CHOLHDL No results found for: LKGM0N    Assessment & Plan:   Problem List Items Addressed This Visit      Digestive   Esophageal carcinoma (HCC) - Primary Patient has esophageal carcinoma with pause in chemotherapy.  He is swallowing well.     Nervous and Auditory   Peripheral neuropathy Patient continues to have peripheral neuropathy but could not tolerate gabapentin        Follow-up: Return in about 6 months (around 06/27/2020).    Brent Bulla, MD

## 2020-01-02 ENCOUNTER — Other Ambulatory Visit: Payer: Self-pay

## 2020-01-02 MED ORDER — MORPHINE SULFATE ER 30 MG PO TBCR
30.0000 mg | EXTENDED_RELEASE_TABLET | Freq: Two times a day (BID) | ORAL | 0 refills | Status: DC
Start: 2020-01-02 — End: 2020-01-06

## 2020-01-02 MED ORDER — OXYCODONE HCL 10 MG PO TABS: 10.0000 mg | ORAL_TABLET | ORAL | 0 refills | Status: DC | PRN

## 2020-01-06 ENCOUNTER — Other Ambulatory Visit: Payer: Self-pay

## 2020-01-06 ENCOUNTER — Other Ambulatory Visit: Payer: Self-pay | Admitting: Hematology and Oncology

## 2020-01-06 DIAGNOSIS — C155 Malignant neoplasm of lower third of esophagus: Secondary | ICD-10-CM

## 2020-01-06 MED ORDER — MORPHINE SULFATE ER 30 MG PO TBCR
30.0000 mg | EXTENDED_RELEASE_TABLET | Freq: Two times a day (BID) | ORAL | 0 refills | Status: DC
Start: 2020-01-06 — End: 2020-01-06

## 2020-01-06 MED ORDER — OXYCODONE HCL 10 MG PO TABS: 10.0000 mg | ORAL_TABLET | ORAL | 0 refills | Status: DC | PRN

## 2020-01-06 MED ORDER — MORPHINE SULFATE ER 30 MG PO TBCR: 30.0000 mg | EXTENDED_RELEASE_TABLET | Freq: Two times a day (BID) | ORAL | 0 refills | Status: DC

## 2020-01-06 NOTE — Telephone Encounter (Signed)
Prescriptions sent for refill

## 2020-01-06 NOTE — Progress Notes (Signed)
Dr Bobby Rumpf requested that I obtain pathology from Dr Feliberto Gottron, ENT's office (in [place]), from biopsy last week. Faxed request sent.

## 2020-01-07 ENCOUNTER — Other Ambulatory Visit: Payer: Self-pay | Admitting: Hematology and Oncology

## 2020-01-07 DIAGNOSIS — C155 Malignant neoplasm of lower third of esophagus: Secondary | ICD-10-CM

## 2020-01-07 MED ORDER — MORPHINE SULFATE ER 30 MG PO TBCR: 30.0000 mg | EXTENDED_RELEASE_TABLET | Freq: Two times a day (BID) | ORAL | 0 refills | Status: DC

## 2020-01-07 MED ORDER — OXYCODONE HCL 10 MG PO TABS: 10.0000 mg | ORAL_TABLET | ORAL | 0 refills | Status: DC | PRN

## 2020-01-13 DIAGNOSIS — J449 Chronic obstructive pulmonary disease, unspecified: Secondary | ICD-10-CM | POA: Diagnosis not present

## 2020-01-13 DIAGNOSIS — J9611 Chronic respiratory failure with hypoxia: Secondary | ICD-10-CM | POA: Diagnosis not present

## 2020-01-24 DIAGNOSIS — J449 Chronic obstructive pulmonary disease, unspecified: Secondary | ICD-10-CM | POA: Diagnosis not present

## 2020-01-24 DIAGNOSIS — J9611 Chronic respiratory failure with hypoxia: Secondary | ICD-10-CM | POA: Diagnosis not present

## 2020-02-05 ENCOUNTER — Telehealth: Payer: Self-pay | Admitting: Oncology

## 2020-02-05 NOTE — Telephone Encounter (Signed)
Spoke with patient about ct chest on 12/13/21and fu appt sched on 02/16/20.

## 2020-02-09 DIAGNOSIS — I2510 Atherosclerotic heart disease of native coronary artery without angina pectoris: Secondary | ICD-10-CM | POA: Diagnosis not present

## 2020-02-09 DIAGNOSIS — Z8501 Personal history of malignant neoplasm of esophagus: Secondary | ICD-10-CM | POA: Diagnosis not present

## 2020-02-09 DIAGNOSIS — C155 Malignant neoplasm of lower third of esophagus: Secondary | ICD-10-CM | POA: Diagnosis not present

## 2020-02-09 DIAGNOSIS — J432 Centrilobular emphysema: Secondary | ICD-10-CM | POA: Diagnosis not present

## 2020-02-09 DIAGNOSIS — C3490 Malignant neoplasm of unspecified part of unspecified bronchus or lung: Secondary | ICD-10-CM | POA: Diagnosis not present

## 2020-02-09 DIAGNOSIS — C3412 Malignant neoplasm of upper lobe, left bronchus or lung: Secondary | ICD-10-CM | POA: Diagnosis not present

## 2020-02-12 DIAGNOSIS — J9611 Chronic respiratory failure with hypoxia: Secondary | ICD-10-CM | POA: Diagnosis not present

## 2020-02-12 DIAGNOSIS — J449 Chronic obstructive pulmonary disease, unspecified: Secondary | ICD-10-CM | POA: Diagnosis not present

## 2020-02-15 NOTE — Progress Notes (Signed)
[facility]  [address] [place],  [place]  [address] [phone]  Clinic Day:  02/16/2020  Referring physician: Lillard Anes,*   HISTORY OF PRESENT ILLNESS:  The patient is a 64 y.o. male with stage IIB (T3 N0 M0) squamous cell lung cancer, who completed his definitive chemoradiation in April 2021.  He comes in today to go over his most recent chest CT to ascertain his new disease baseline.  Since his last visit, the patient has been doing okay.  He has chronic problems with shortness of breath recently to where he is on 2L of oxygen.  However, he denies having any new respiratory issues that concern him for disease recurrence.  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  He underwent neoadjuvant chemoradiation for this cancer before undergoing a transhiatal esophagectomy in November 2013.  Fortunately, that definitive therapy led to him being cured of his GE junction malignancy.   PHYSICAL EXAM:  Blood pressure (!) 144/76, pulse 95, temperature 98.1 F (36.7 C), resp. rate 16, height 6\' 2"  (1.88 m), weight 143 lb 1.6 oz (64.9 kg), SpO2 93 %. Wt Readings from Last 3 Encounters:  02/16/20 143 lb 1.6 oz (64.9 kg)  12/29/19 142 lb (64.4 kg)  12/10/19 135 lb (61.2 kg)   Body mass index is 18.37 kg/m. Performance status (ECOG): 1 Physical Exam Constitutional:      Appearance: Normal appearance. He is not ill-appearing.  HENT:     Mouth/Throat:     Mouth: Mucous membranes are moist.     Pharynx: Oropharynx is clear. No oropharyngeal exudate or posterior oropharyngeal erythema.  Cardiovascular:     Rate and Rhythm: Normal rate and regular rhythm.     Heart sounds: No murmur heard. No friction rub. No gallop.   Pulmonary:     Effort: Pulmonary effort is normal. No respiratory distress (decreased breath sounds in his bilateral apices).     Breath sounds: No wheezing,  rhonchi or rales.  Chest:  Breasts:     Right: No axillary adenopathy or supraclavicular adenopathy.     Left: No axillary adenopathy or supraclavicular adenopathy.    Abdominal:     General: Bowel sounds are normal. There is no distension.     Palpations: Abdomen is soft. There is no mass.     Tenderness: There is no abdominal tenderness.  Musculoskeletal:        General: No swelling.     Right lower leg: No edema.     Left lower leg: No edema.  Lymphadenopathy:     Cervical: No cervical adenopathy.     Upper Body:     Right upper body: No supraclavicular or axillary adenopathy.     Left upper body: No supraclavicular or axillary adenopathy.     Lower Body: No right inguinal adenopathy. No left inguinal adenopathy.  Skin:    General: Skin is warm.     Coloration: Skin is not jaundiced.     Findings: No lesion or rash.  Neurological:     General: No focal deficit present.     Mental Status: He is alert and oriented to person, place, and time. Mental status is at baseline.     Cranial Nerves: Cranial nerves are intact.  Psychiatric:        Mood and Affect: Mood normal.        Behavior: Behavior normal.  Thought Content: Thought content normal.     LABS:     STUDIES:  His chest CT revealed the following: FINDINGS: Cardiovascular: Heart size appears normal. No pericardial effusion. Aortic atherosclerosis. Coronary artery atherosclerotic calcifications. Enlargement of the right and left pulmonary arteries noted.  Mediastinum/Nodes: No enlarged axillary, supraclavicular lymph nodes.  Index pre-vascular lymph node measures 0.7 cm, image 31/2. Unchanged.  Index right paratracheal lymph node measures 0.7 cm, image 44/2. Also stable. Index subcarinal lymph node measures 1.3 cm, image 72/2. Previously 1.4 cm.  Postoperative changes from esophagectomy with gastric interposition.  Lungs/Pleura: Advanced changes of centrilobular emphysema. No pleural effusion.  Interval improvement previously noted diffuse ground-glass and interlobular septal thickening. Residual geographic area of ground-glass attenuation and mild nodularity is noted in the posterior medial left lower lobe, image 104/4. Favor resolving inflammation/infection.  The left upper lobe mass with chest wall involvement is again noted. Surrounding architectural distortion, fibrosis, and volume loss is again identified. This measures 4.8 x 1.4 cm, image 56/2. Previously 4.7 x 1.7 cm.  Nodule within the lateral left upper lobe measures 1.7 x 1.1 cm, image 59/4. Previously 2.1 x 1.2 cm.  4 mm nodule within the lateral right upper lobe is stable, image 67/4.  Chronic atelectasis in the right lower lobe adjacent to gastric pull-through.  Upper Abdomen: IVC filter. No acute findings within the imaged portions of the upper abdomen. Aortic atherosclerosis.  Musculoskeletal: Stable erosive changes involving the anterior aspect of the left fourth and fifth ribs. Remote left third rib fracture deformity, unchanged.Marland Kitchen  IMPRESSION: 1. No significant change in the appearance of left upper lobe pleural mass with chest wall involvement. Secondary erosive changes involving the anterior left fourth and fifth ribs are also stable. 2. Left upper lobe nodule with surrounding architectural distortion appears decreased from previous exam. 3. Stable appearance of prominent mediastinal lymph nodes as detailed above. 4. Partial clearing of left lower lobe ground-glass and septal thickening which is favored to represent sequelae of inflammation or infection. 5. Enlargement of the pulmonary artery suggestive of underlying PA hypertension. 6. Aortic Atherosclerosis (ICD10-I70.0) and Emphysema (ICD10-J43.9).   ASSESSMENT & PLAN:  Assessment/Plan:  A 64 y.o. male with stage IIB (T3 N0 M0) squamous cell lung cancer, who completed his definitive chemoradiation in April 2021.  In clinic today, I went  over all of his CT scan images with him, for which he could there remains no evidence of disease progression.  Clinically, the patient appears to be doing fine.  I will see this patient back in another 4 months for repeat clinical assessment, with a chest CT being done a day before his next visit for his continued radiographic lung cancer surveillance.  If his chest CT findings remain stable, I will begin alternating chest x-rays and chest CT scans with all future visits.  The patient understands all the plans discussed today and is in agreement with them.      Tracina Beaumont Macarthur Critchley, MD

## 2020-02-16 ENCOUNTER — Other Ambulatory Visit: Payer: Self-pay | Admitting: Oncology

## 2020-02-16 ENCOUNTER — Telehealth: Payer: Self-pay | Admitting: Oncology

## 2020-02-16 ENCOUNTER — Inpatient Hospital Stay: Payer: Medicare HMO | Attending: Oncology | Admitting: Oncology

## 2020-02-16 ENCOUNTER — Other Ambulatory Visit: Payer: Self-pay

## 2020-02-16 VITALS — BP 144/76 | HR 95 | Temp 98.1°F | Resp 16 | Ht 74.0 in | Wt 143.1 lb

## 2020-02-16 DIAGNOSIS — C3490 Malignant neoplasm of unspecified part of unspecified bronchus or lung: Secondary | ICD-10-CM

## 2020-02-16 DIAGNOSIS — C3412 Malignant neoplasm of upper lobe, left bronchus or lung: Secondary | ICD-10-CM

## 2020-02-16 DIAGNOSIS — C155 Malignant neoplasm of lower third of esophagus: Secondary | ICD-10-CM

## 2020-02-16 NOTE — Telephone Encounter (Signed)
Per 12/20 LOS, patient scheduled for April 2022 Follow Up Appt.  Waiting Auth to Schedule Labs, CT Scan (March).  Patient give Appt Calendar with Follow Up on it

## 2020-02-17 ENCOUNTER — Encounter: Payer: Self-pay | Admitting: Oncology

## 2020-02-23 DIAGNOSIS — J9611 Chronic respiratory failure with hypoxia: Secondary | ICD-10-CM | POA: Diagnosis not present

## 2020-02-23 DIAGNOSIS — J449 Chronic obstructive pulmonary disease, unspecified: Secondary | ICD-10-CM | POA: Diagnosis not present

## 2020-03-10 ENCOUNTER — Encounter: Payer: Self-pay | Admitting: Legal Medicine

## 2020-03-10 ENCOUNTER — Ambulatory Visit (INDEPENDENT_AMBULATORY_CARE_PROVIDER_SITE_OTHER): Payer: Medicare HMO | Admitting: Legal Medicine

## 2020-03-10 ENCOUNTER — Other Ambulatory Visit: Payer: Self-pay

## 2020-03-10 VITALS — BP 130/70 | HR 90 | Temp 98.2°F | Resp 18 | Ht 74.0 in | Wt 144.0 lb

## 2020-03-10 DIAGNOSIS — C799 Secondary malignant neoplasm of unspecified site: Secondary | ICD-10-CM | POA: Diagnosis not present

## 2020-03-10 DIAGNOSIS — C419 Malignant neoplasm of bone and articular cartilage, unspecified: Secondary | ICD-10-CM | POA: Diagnosis not present

## 2020-03-10 DIAGNOSIS — R519 Headache, unspecified: Secondary | ICD-10-CM | POA: Diagnosis not present

## 2020-03-10 NOTE — Progress Notes (Signed)
Subjective:  Patient ID: [name] Ill., male    DOB: Mar 26, 1955  Age: 65 y.o. MRN: [id number]  Chief Complaint  Patient presents with  . Headache    States he has been having frequent headaches for 2 weeks. He states he has been taking tylenol.    HPI: compliant of headaches for 2 week when he wakes up .  He is having nose bleeds.  He is on chronic O2 2L/min for months.  Temporal headaches, go away after tylenol.  He is on continueous O2.   Current Outpatient Medications on File Prior to Visit  Medication Sig Dispense Refill  . ALPRAZolam (XANAX) 0.25 MG tablet Take 1 tablet (0.25 mg total) by mouth at bedtime. 30 tablet 2  . Hydroactive Dressings (DUODERM HYDROACTIVE) PSTE Apply 1 application topically 2 (two) times a week. 30 g 3  . ipratropium-albuterol (DUONEB) 0.5-2.5 (3) MG/3ML SOLN Take 3 mLs by nebulization 4 (four) times daily.    Marland Kitchen ipratropium-albuterol (DUONEB) 0.5-2.5 (3) MG/3ML SOLN     . LORazepam (ATIVAN) 1 MG tablet     . omeprazole (PRILOSEC) 40 MG capsule Take 40 mg by mouth daily.    . TRELEGY ELLIPTA 100-62.5-25 MCG/INH AEPB      No current facility-administered medications on file prior to visit.   Past Medical History:  Diagnosis Date  . Asthma    hx of asthma as a child  . 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 COPD (chronic obstructive pulmonary disease) (HCC)   . Esophageal cancer (HCC)   . Hx of cervical spine surgery   . Hypertension   . Lung nodules   . Obstructive sleep apnea   . Personal history of malignant neoplasm of esophagus 08/18/2019  . Pulmonary embolism [facility])    Past Surgical History:  Procedure Laterality Date  . CERVICAL SPINE SURGERY    . EUS  08/03/2011   Procedure: UPPER ENDOSCOPIC ULTRASOUND (EUS) LINEAR;  Surgeon: Rachael Fee, MD;  Location: [facility];  Service: Endoscopy;  Laterality: N/A;  . IVC FILTER PLACEMENT ([facility] HX)  04/03/2016  . small clot in right lung  04/10/2013  . stomach pull through surgery 2013   2013      . TONSILLECTOMY      Family History  Problem Relation Age of Onset  . Hypertension Father   . COPD Father   . CAD Father   . Pancreatic cancer Sister   . Hypertension Mother    Social History   Socioeconomic History  . Marital status: Married    Spouse name: Not on file  . Number of children: Not on file  . Years of education: Not on file  . Highest education level: Not on file  Occupational History  . Not on file  Tobacco Use  . Smoking status: Former Smoker    Packs/day: 1.00    Years: 42.00    Pack years: 42.00    Types: Cigarettes    Quit date: 03/04/2014    Years since quitting: 6.0  . Smokeless tobacco: Never Used  Vaping Use  . Vaping Use: Unknown  Substance and Sexual Activity  . Alcohol use: No  . Drug use: Not Currently    Types: Marijuana  . Sexual activity: Yes    Partners: Female    Birth control/protection: None  Other Topics Concern  . Not on file  Social History Narrative   Lives with spouse   Social Determinants of Health   Financial Resource Strain:  Not on file  Food Insecurity: Not on file  Transportation Needs: Not on file  Physical Activity: Not on file  Stress: Not on file  Social Connections: Not on file    Review of Systems  Constitutional: Negative for activity change, appetite change and fever.  HENT: Negative for congestion and sinus pain.   Eyes: Negative for visual disturbance.  Respiratory: Negative for cough and chest tightness.   Cardiovascular: Negative for chest pain, palpitations and leg swelling.  Gastrointestinal: Negative.   Endocrine: Negative for polyuria.  Genitourinary: Negative for difficulty urinating, dysuria and urgency.  Musculoskeletal: Negative.   Neurological: Positive for headaches.  Psychiatric/Behavioral: Negative.      Objective:  BP 130/70 (BP Location: Right Arm, Patient Position: Sitting, Cuff Size: Normal)   Pulse 90   Temp 98.2 F (36.8 C) (Temporal)   Resp 18   Ht 6\' 2"   (1.88 m)   Wt 144 lb (65.3 kg)   SpO2 (!) 89% Comment: 2 liters of oxygen  BMI 18.49 kg/m   BP/Weight 03/10/2020 02/16/2020 12/29/2019  Systolic BP 130 144 110  Diastolic BP 70 76 60  Wt. (Lbs) 144 143.1 142  BMI 18.49 18.37 18.23    Physical Exam Vitals reviewed.  Constitutional:      General: He is not in acute distress.    Appearance: Normal appearance.  HENT:     Head: Normocephalic.     Right Ear: Tympanic membrane, ear canal and external ear normal.     Left Ear: Tympanic membrane, ear canal and external ear normal.     Mouth/Throat:     Mouth: Mucous membranes are moist.     Pharynx: Oropharynx is clear.  Eyes:     Extraocular Movements: Extraocular movements intact.     Conjunctiva/sclera: Conjunctivae normal.     Pupils: Pupils are equal, round, and reactive to light.  Cardiovascular:     Rate and Rhythm: Normal rate and regular rhythm.     Pulses: Normal pulses.     Heart sounds: Normal heart sounds. No murmur heard. No gallop.   Pulmonary:     Effort: Pulmonary effort is normal. No respiratory distress.  Abdominal:     General: Abdomen is flat. There is distension.     Tenderness: There is no abdominal tenderness.     Comments: Recurrent cancer  Musculoskeletal:     Cervical back: Normal range of motion and neck supple.  Skin:    General: Skin is warm.     Capillary Refill: Capillary refill takes less than 2 seconds.  Neurological:     General: No focal deficit present.     Mental Status: He is alert and oriented to person, place, and time.       Lab Results  Component Value Date   WBC 10.0 10/28/2019   HGB 10.6 (L) 10/28/2019   HCT 33.4 (L) 10/28/2019   PLT 210 10/28/2019   GLUCOSE 107 (H) 11/04/2019   ALT 14 11/04/2019   AST 20 11/04/2019   NA 140 11/04/2019   K 4.1 11/04/2019   CL 99 11/04/2019   CREATININE 0.96 11/04/2019   BUN 9 11/04/2019   CO2 27 11/04/2019   TSH 2.440 10/28/2019      Assessment & Plan:   Diagnoses and all  orders for this visit: Metastasis from malignant neoplasm of bone (HCC) -     CBC with Differential/Platelet His cancer is doing well with metasteses. He goes to cancer center  Worsening headaches -  CBC with Differential/Platelet -     Comprehensive metabolic panel Patient is getting headaches, he is on O2 continuously and having dry nose and epistaxis at times.  We will order moisturizing air and use vaseline in nose. We ordered moisturize with cool mist with air line, he needs this and it is sent to [facility]. This is necessary to maintain present functional status        I spent 20 minutes dedicated to the care of this patient on the date of this encounter to include face-to-face time with the patient, as well as:   Follow-up: Return in about 3 months (around 06/08/2020).  An After Visit Summary was printed and given to the patient.  Brent Bulla, MD [facility] [phone]

## 2020-03-11 LAB — CBC WITH DIFFERENTIAL/PLATELET
Basophils Absolute: 0.1 10*3/uL (ref 0.0–0.2)
Basos: 1 %
EOS (ABSOLUTE): 0.3 10*3/uL (ref 0.0–0.4)
Eos: 4 %
Hematocrit: 35.4 % — ABNORMAL LOW (ref 37.5–51.0)
Hemoglobin: 11.6 g/dL — ABNORMAL LOW (ref 13.0–17.7)
Immature Grans (Abs): 0 10*3/uL (ref 0.0–0.1)
Immature Granulocytes: 0 %
Lymphocytes Absolute: 0.7 10*3/uL (ref 0.7–3.1)
Lymphs: 9 %
MCH: 28.4 pg (ref 26.6–33.0)
MCHC: 32.8 g/dL (ref 31.5–35.7)
MCV: 87 fL (ref 79–97)
Monocytes Absolute: 0.5 10*3/uL (ref 0.1–0.9)
Monocytes: 7 %
Neutrophils Absolute: 6.7 10*3/uL (ref 1.4–7.0)
Neutrophils: 79 %
Platelets: 243 10*3/uL (ref 150–450)
RBC: 4.08 x10E6/uL — ABNORMAL LOW (ref 4.14–5.80)
RDW: 14.7 % (ref 11.6–15.4)
WBC: 8.3 10*3/uL (ref 3.4–10.8)

## 2020-03-11 LAB — COMPREHENSIVE METABOLIC PANEL
ALT: 10 IU/L (ref 0–44)
AST: 14 IU/L (ref 0–40)
Albumin/Globulin Ratio: 1.1 — ABNORMAL LOW (ref 1.2–2.2)
Albumin: 3.9 g/dL (ref 3.8–4.8)
Alkaline Phosphatase: 93 IU/L (ref 44–121)
BUN/Creatinine Ratio: 10 (ref 10–24)
BUN: 10 mg/dL (ref 8–27)
Bilirubin Total: <0.2 mg/dL (ref 0.0–1.2)
CO2: 29 mmol/L (ref 20–29)
Calcium: 9.6 mg/dL (ref 8.6–10.2)
Chloride: 103 mmol/L (ref 96–106)
Creatinine, Ser: 0.99 mg/dL (ref 0.76–1.27)
GFR calc Af Amer: 93 mL/min/{1.73_m2} (ref >59)
GFR calc non Af Amer: 81 mL/min/{1.73_m2} (ref >59)
Globulin, Total: 3.5 g/dL (ref 1.5–4.5)
Glucose: 121 mg/dL — ABNORMAL HIGH (ref 65–99)
Potassium: 4.5 mmol/L (ref 3.5–5.2)
Sodium: 146 mmol/L — ABNORMAL HIGH (ref 134–144)
Total Protein: 7.4 g/dL (ref 6.0–8.5)

## 2020-03-11 NOTE — Progress Notes (Signed)
Anemia improving, glucose 121- add A1c, kidney and liver tests normal lp

## 2020-03-13 LAB — HGB A1C W/O EAG: Hgb A1c MFr Bld: 5.4 % (ref 4.8–5.6)

## 2020-03-13 LAB — SPECIMEN STATUS REPORT

## 2020-03-13 NOTE — Progress Notes (Signed)
A1c 5.4 norml, no diabetes lp

## 2020-03-14 DIAGNOSIS — J449 Chronic obstructive pulmonary disease, unspecified: Secondary | ICD-10-CM | POA: Diagnosis not present

## 2020-03-14 DIAGNOSIS — J9611 Chronic respiratory failure with hypoxia: Secondary | ICD-10-CM | POA: Diagnosis not present

## 2020-03-25 DIAGNOSIS — J9611 Chronic respiratory failure with hypoxia: Secondary | ICD-10-CM | POA: Diagnosis not present

## 2020-03-25 DIAGNOSIS — J449 Chronic obstructive pulmonary disease, unspecified: Secondary | ICD-10-CM | POA: Diagnosis not present

## 2020-04-05 DIAGNOSIS — J9611 Chronic respiratory failure with hypoxia: Secondary | ICD-10-CM | POA: Diagnosis not present

## 2020-04-05 DIAGNOSIS — R918 Other nonspecific abnormal finding of lung field: Secondary | ICD-10-CM | POA: Diagnosis not present

## 2020-04-05 DIAGNOSIS — J441 Chronic obstructive pulmonary disease with (acute) exacerbation: Secondary | ICD-10-CM | POA: Diagnosis not present

## 2020-04-05 DIAGNOSIS — G4733 Obstructive sleep apnea (adult) (pediatric): Secondary | ICD-10-CM | POA: Diagnosis not present

## 2020-04-14 DIAGNOSIS — J9611 Chronic respiratory failure with hypoxia: Secondary | ICD-10-CM | POA: Diagnosis not present

## 2020-04-14 DIAGNOSIS — J449 Chronic obstructive pulmonary disease, unspecified: Secondary | ICD-10-CM | POA: Diagnosis not present

## 2020-04-19 DIAGNOSIS — R918 Other nonspecific abnormal finding of lung field: Secondary | ICD-10-CM | POA: Diagnosis not present

## 2020-04-19 DIAGNOSIS — J9611 Chronic respiratory failure with hypoxia: Secondary | ICD-10-CM | POA: Diagnosis not present

## 2020-04-19 DIAGNOSIS — G4733 Obstructive sleep apnea (adult) (pediatric): Secondary | ICD-10-CM | POA: Diagnosis not present

## 2020-04-25 DIAGNOSIS — J9611 Chronic respiratory failure with hypoxia: Secondary | ICD-10-CM | POA: Diagnosis not present

## 2020-04-25 DIAGNOSIS — J449 Chronic obstructive pulmonary disease, unspecified: Secondary | ICD-10-CM | POA: Diagnosis not present

## 2020-05-12 DIAGNOSIS — J9611 Chronic respiratory failure with hypoxia: Secondary | ICD-10-CM | POA: Diagnosis not present

## 2020-05-12 DIAGNOSIS — J449 Chronic obstructive pulmonary disease, unspecified: Secondary | ICD-10-CM | POA: Diagnosis not present

## 2020-05-23 DIAGNOSIS — J9611 Chronic respiratory failure with hypoxia: Secondary | ICD-10-CM | POA: Diagnosis not present

## 2020-05-23 DIAGNOSIS — J449 Chronic obstructive pulmonary disease, unspecified: Secondary | ICD-10-CM | POA: Diagnosis not present

## 2020-06-10 ENCOUNTER — Telehealth: Payer: Self-pay | Admitting: Oncology

## 2020-06-10 NOTE — Telephone Encounter (Signed)
06/10/20 Spoke with patient and confirmed all appts

## 2020-06-12 DIAGNOSIS — J9611 Chronic respiratory failure with hypoxia: Secondary | ICD-10-CM | POA: Diagnosis not present

## 2020-06-12 DIAGNOSIS — J449 Chronic obstructive pulmonary disease, unspecified: Secondary | ICD-10-CM | POA: Diagnosis not present

## 2020-06-14 ENCOUNTER — Encounter: Payer: Self-pay | Admitting: Oncology

## 2020-06-15 DIAGNOSIS — I2510 Atherosclerotic heart disease of native coronary artery without angina pectoris: Secondary | ICD-10-CM | POA: Diagnosis not present

## 2020-06-15 DIAGNOSIS — J439 Emphysema, unspecified: Secondary | ICD-10-CM | POA: Diagnosis not present

## 2020-06-15 DIAGNOSIS — Z8501 Personal history of malignant neoplasm of esophagus: Secondary | ICD-10-CM | POA: Diagnosis not present

## 2020-06-15 DIAGNOSIS — J479 Bronchiectasis, uncomplicated: Secondary | ICD-10-CM | POA: Diagnosis not present

## 2020-06-15 DIAGNOSIS — C159 Malignant neoplasm of esophagus, unspecified: Secondary | ICD-10-CM | POA: Diagnosis not present

## 2020-06-15 NOTE — Progress Notes (Signed)
[facility]  [address] [place],  [place]  [address] [phone]  Clinic Day:  06/16/2020  Referring physician: Lillard Anes,*   HISTORY OF PRESENT ILLNESS:  The patient is a 65 y.o. male with stage IIB (T3 N0 M0) squamous cell lung cancer, who completed his definitive chemoradiation in April 2021.  He comes in today to go over his most recent chest CT to ascertain his new disease baseline.  Since his last visit, the patient has been doing much better.  Although he is on 2L of oxygen, he does not use it around the clock as his dyspnea is much less of a problem.  He denies having other respiratory symptoms which concern him for disease recurrence.      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  He underwent neoadjuvant chemoradiation for this cancer before undergoing a transhiatal esophagectomy in November 2013.  Fortunately, that definitive therapy led to him being cured of his GE junction malignancy.   PHYSICAL EXAM:  Blood pressure (!) 153/78, pulse 79, temperature 98 F (36.7 C), resp. rate 16, height 6\' 2"  (1.88 m), weight 147 lb 14.4 oz (67.1 kg), SpO2 90 %. Wt Readings from Last 3 Encounters:  06/16/20 147 lb 14.4 oz (67.1 kg)  03/10/20 144 lb (65.3 kg)  02/16/20 143 lb 1.6 oz (64.9 kg)   Body mass index is 18.99 kg/m. Performance status (ECOG): 1 Physical Exam Constitutional:      Appearance: Normal appearance. He is not ill-appearing.  HENT:     Mouth/Throat:     Mouth: Mucous membranes are moist.     Pharynx: Oropharynx is clear. No oropharyngeal exudate or posterior oropharyngeal erythema.  Cardiovascular:     Rate and Rhythm: Normal rate and regular rhythm.     Heart sounds: No murmur heard. No friction rub. No gallop.   Pulmonary:     Effort: Pulmonary effort is normal. No respiratory distress (decreased breath sounds in his bilateral apices).      Breath sounds: No wheezing, rhonchi or rales.  Chest:  Breasts:     Right: No axillary adenopathy or supraclavicular adenopathy.     Left: No axillary adenopathy or supraclavicular adenopathy.    Abdominal:     General: Bowel sounds are normal. There is no distension.     Palpations: Abdomen is soft. There is no mass.     Tenderness: There is no abdominal tenderness.  Musculoskeletal:        General: No swelling.     Right lower leg: No edema.     Left lower leg: No edema.  Lymphadenopathy:     Cervical: No cervical adenopathy.     Upper Body:     Right upper body: No supraclavicular or axillary adenopathy.     Left upper body: No supraclavicular or axillary adenopathy.     Lower Body: No right inguinal adenopathy. No left inguinal adenopathy.  Skin:    General: Skin is warm.     Coloration: Skin is not jaundiced.     Findings: No lesion or rash.  Neurological:     General: No focal deficit present.     Mental Status: He is alert and oriented to person, place, and time. Mental status is at baseline.     Cranial Nerves: Cranial nerves are intact.  Psychiatric:        Mood and Affect: Mood normal.  Behavior: Behavior normal.        Thought Content: Thought content normal.    STUDIES:  His chest CT revealed the following: FINDINGS: Cardiovascular: Calcified and noncalcified atheromatous plaque in the thoracic aorta. No aneurysmal dilation. RIGHT-sided Port-A-Cath entering via subclavian approach terminating in the mid SVC. Heart size normal without pericardial effusion. Central pulmonary vasculature unremarkable.  Mediastinum/Nodes: Scattered small lymph nodes throughout the mediastinum.  Superior mediastinal lymph node 8 mm previously 7 mm (image 31, series 2)  Small RIGHT paratracheal lymph nodes remain less than a cm slight increasing conspicuity, 9 mm short axis is the largest lymph node anterior to the trachea.  Subcarinal nodal tissue with increased  fullness approximately 1.5 cm short axis previously approximately 12 mm.  No axillary lymphadenopathy.  No hilar adenopathy.  Signs of esophagectomy with gastric pull-through similar appearance to prior imaging.  Lungs/Pleura: Severe pulmonary emphysema. Nodular area in the LEFT upper lobe now shows some cavitation with dependent "soft tissue". Area measuring 1.4 x 0.9 cm previously 1.7 x 1.1 cm. Bronchiectasis and septal thickening with parenchymal distortion extending into the LEFT upper lobe. Airways are patent. Debris in the trachea proximally. This is dependent. No sign of pleural effusion.  Pleural based masslike area on image 58 of series 4 measures 4.3 x 1.0 cm as compared to 4.6 x 1.7 cm on the prior study.  Upper Abdomen: Incidental imaging of upper abdominal contents shows partial visualization of IVC filter. No acute upper abdominal finding on limited assessment.  Musculoskeletal: Spinal degenerative changes. No acute or destructive bone process. Bony erosion and or regularity with similar appearance involving LEFT anterior third and fourth ribs.  IMPRESSION: 1. Signs of esophagectomy with gastric pull-through similar appearance to prior imaging. 2. Diminished size of pleural based mass with secondary bony changes that may represent direct involvement or post treatment related changes, correlate with any ongoing radiation therapy. 3. Nodular area in the LEFT upper lobe now shows some cavitation with dependent soft tissue. Correlate with any signs of infection. 4. Increasing, slight increase in size of mediastinal lymph nodes. PET scan may be helpful as warranted for further assessment. 5. Severe pulmonary emphysema. 6. Aortic atherosclerosis.  Aortic Atherosclerosis (ICD10-I70.0) and Emphysema (ICD10-J43.9).  ASSESSMENT & PLAN:  Assessment/Plan:  A 65 y.o. male with stage IIB (T3 N0 M0) squamous cell lung cancer, who completed his definitive chemoradiation in  April 2021.  In clinic today, I went over all of his CT scan images with him, for which he could there remains no obvious evidence of disease progression.  The mediastinal lymphadenopathy in question has only changed in size by millimeters, which I showed him today.  I do not think a PET scan is necessary at this time.  I will repeat another chest CT in 4 months to see if his mediastinal nodes have grown.  If so, a PET scan +/- biopsy would be done next.  I am pleased as his pleural-based lesion appears to be smaller, with associated radiation changes being seen.   Clinically, the patient appears to be doing well.  I will see this patient back in another 4 months for repeat clinical assessment, with a chest CT being done a day before his next visit for his continued radiographic lung cancer surveillance. The patient understands all the plans discussed today and is in agreement with them.      Brandyn Lowrey Macarthur Critchley, MD

## 2020-06-16 ENCOUNTER — Inpatient Hospital Stay: Payer: Medicare HMO | Attending: Oncology | Admitting: Oncology

## 2020-06-16 ENCOUNTER — Other Ambulatory Visit: Payer: Self-pay | Admitting: Oncology

## 2020-06-16 ENCOUNTER — Other Ambulatory Visit: Payer: Self-pay

## 2020-06-16 ENCOUNTER — Telehealth: Payer: Self-pay | Admitting: Oncology

## 2020-06-16 DIAGNOSIS — C3412 Malignant neoplasm of upper lobe, left bronchus or lung: Secondary | ICD-10-CM

## 2020-06-16 DIAGNOSIS — C3490 Malignant neoplasm of unspecified part of unspecified bronchus or lung: Secondary | ICD-10-CM | POA: Insufficient documentation

## 2020-06-16 NOTE — Telephone Encounter (Signed)
Per 4/20 los next appt scheduled and given to patient

## 2020-06-23 DIAGNOSIS — J449 Chronic obstructive pulmonary disease, unspecified: Secondary | ICD-10-CM | POA: Diagnosis not present

## 2020-06-23 DIAGNOSIS — J9611 Chronic respiratory failure with hypoxia: Secondary | ICD-10-CM | POA: Diagnosis not present

## 2020-06-25 DIAGNOSIS — R051 Acute cough: Secondary | ICD-10-CM | POA: Diagnosis not present

## 2020-06-28 ENCOUNTER — Encounter: Payer: Self-pay | Admitting: Oncology

## 2020-06-30 ENCOUNTER — Encounter: Payer: Self-pay | Admitting: Legal Medicine

## 2020-06-30 ENCOUNTER — Telehealth (INDEPENDENT_AMBULATORY_CARE_PROVIDER_SITE_OTHER): Payer: Medicare HMO | Admitting: Legal Medicine

## 2020-06-30 VITALS — Ht 74.0 in | Wt 144.0 lb

## 2020-06-30 DIAGNOSIS — C3412 Malignant neoplasm of upper lobe, left bronchus or lung: Secondary | ICD-10-CM | POA: Diagnosis not present

## 2020-06-30 DIAGNOSIS — J449 Chronic obstructive pulmonary disease, unspecified: Secondary | ICD-10-CM

## 2020-06-30 DIAGNOSIS — C7951 Secondary malignant neoplasm of bone: Secondary | ICD-10-CM

## 2020-06-30 MED ORDER — LEVOFLOXACIN 500 MG PO TABS: 500.0000 mg | ORAL_TABLET | Freq: Every day | ORAL | 0 refills | Status: AC

## 2020-06-30 NOTE — Progress Notes (Signed)
Virtual Visit via Telephone Note   This visit type was conducted due to national recommendations for restrictions regarding the COVID-19 Pandemic (e.g. social distancing) in an effort to limit this patient's exposure and mitigate transmission in our community.  Due to his co-morbid illnesses, this patient is at least at moderate risk for complications without adequate follow up.  This format is felt to be most appropriate for this patient at this time.  The patient did not have access to video technology/had technical difficulties with video requiring transitioning to audio format only (telephone).  All issues noted in this document were discussed and addressed.  No physical exam could be performed with this format.  Patient verbally consented to a telehealth visit.   Date:  06/30/2020   ID:  [name] Ill., DOB 1956/01/19, MRN [id number]  Patient Location: Home Provider Location: Office/Clinic  PCP:  Abigail Miyamoto, MD   Evaluation Performed:  New Patient Evaluation  Chief Complaint:  Patient with lung cancer with infection and no fever  History of Present Illness:    [name] Toupin. is a 65 y.o. male with Patient with lung cancer with infection and no fever.  He is coughing and having some sputum production.    The patient does not have symptoms concerning for COVID-19 infection (fever, chills, cough, or new shortness of breath).    Past Medical History:  Diagnosis Date  . Asthma    hx of asthma as a child  . 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 COPD (chronic obstructive pulmonary disease) (HCC)   . Esophageal cancer (HCC)   . Hx of cervical spine surgery   . Hypertension   . Lung nodules   . Obstructive sleep apnea   . Personal history of malignant neoplasm of esophagus 08/18/2019  . Pulmonary embolism [facility])     Past Surgical History:  Procedure Laterality Date  . CERVICAL SPINE SURGERY    . EUS  08/03/2011   Procedure: UPPER ENDOSCOPIC ULTRASOUND  (EUS) LINEAR;  Surgeon: Rachael Fee, MD;  Location: [facility];  Service: Endoscopy;  Laterality: N/A;  . IVC FILTER PLACEMENT ([facility] HX)  04/03/2016  . small clot in right lung  04/10/2013  . stomach pull through surgery 2013  2013      . TONSILLECTOMY      Family History  Problem Relation Age of Onset  . Hypertension Father   . COPD Father   . CAD Father   . Pancreatic cancer Sister   . Hypertension Mother     Social History   Socioeconomic History  . Marital status: Married    Spouse name: Not on file  . Number of children: Not on file  . Years of education: Not on file  . Highest education level: Not on file  Occupational History  . Not on file  Tobacco Use  . Smoking status: Former Smoker    Packs/day: 1.00    Years: 42.00    Pack years: 42.00    Types: Cigarettes    Quit date: 03/04/2014    Years since quitting: 6.3  . Smokeless tobacco: Never Used  Vaping Use  . Vaping Use: Unknown  Substance and Sexual Activity  . Alcohol use: No  . Drug use: Not Currently    Types: Marijuana  . Sexual activity: Yes    Partners: Female    Birth control/protection: None  Other Topics Concern  . Not on file  Social History Narrative   Lives with  spouse   Social Determinants of Corporate investment banker Strain: Not on file  Food Insecurity: Not on file  Transportation Needs: Not on file  Physical Activity: Not on file  Stress: Not on file  Social Connections: Not on file  Intimate Partner Violence: Not on file    Outpatient Medications Prior to Visit  Medication Sig Dispense Refill  . ALPRAZolam (XANAX) 0.25 MG tablet Take 1 tablet (0.25 mg total) by mouth at bedtime. 30 tablet 2  . Hydroactive Dressings (DUODERM HYDROACTIVE) PSTE Apply 1 application topically 2 (two) times a week. 30 g 3  . ipratropium-albuterol (DUONEB) 0.5-2.5 (3) MG/3ML SOLN Take 3 mLs by nebulization 4 (four) times daily.    Marland Kitchen ipratropium-albuterol (DUONEB) 0.5-2.5 (3) MG/3ML SOLN      . LORazepam (ATIVAN) 1 MG tablet     . omeprazole (PRILOSEC) 40 MG capsule Take 40 mg by mouth daily.    . TRELEGY ELLIPTA 100-62.5-25 MCG/INH AEPB      No facility-administered medications prior to visit.    Allergies:   Warfarin and related   Social History   Tobacco Use  . Smoking status: Former Smoker    Packs/day: 1.00    Years: 42.00    Pack years: 42.00    Types: Cigarettes    Quit date: 03/04/2014    Years since quitting: 6.3  . Smokeless tobacco: Never Used  Vaping Use  . Vaping Use: Unknown  Substance Use Topics  . Alcohol use: No  . Drug use: Not Currently    Types: Marijuana     Review of Systems  Constitutional: Negative for chills and fever.  HENT: Positive for congestion. Negative for sinus pain.   Respiratory: Positive for cough and sputum production.   Cardiovascular: Positive for chest pain.  Gastrointestinal: Negative for melena.  Genitourinary: Negative for dysuria.  Musculoskeletal: Negative for myalgias.  Neurological: Negative.   Psychiatric/Behavioral: Negative.      Labs/Other Tests and Data Reviewed:    Recent Labs: 10/28/2019: TSH 2.440 03/10/2020: ALT 10; BUN 10; Creatinine, Ser 0.99; Hemoglobin 11.6; Platelets 243; Potassium 4.5; Sodium 146   Recent Lipid Panel No results found for: CHOL, TRIG, HDL, CHOLHDL, LDLCALC, LDLDIRECT  Wt Readings from Last 3 Encounters:  06/30/20 144 lb (65.3 kg)  06/16/20 147 lb 14.4 oz (67.1 kg)  03/10/20 144 lb (65.3 kg)     Objective:    Vital Signs:  Ht 6\' 2"  (1.88 m)   Wt 144 lb (65.3 kg)   BMI 18.49 kg/m    Physical Exam reviewed  ASSESSMENT & PLAN:    Diagnoses and all orders for this visit: Malignant neoplasm of upper lobe of left lung (HCC) Metastasis to bone (HCC) Chronic obstructive pulmonary disease, unspecified COPD type (HCC) -     levofloxacin (LEVAQUIN) 500 MG tablet; Take 1 tablet (500 mg total) by mouth daily for 7 days.       COVID-19 Education: The signs and symptoms  of COVID-19 were discussed with the patient and how to seek care for testing (follow up with PCP or arrange E-visit). The importance of social distancing was discussed today.   I spent 20 minutes dedicated to the care of this patient on the date of this encounter to include face-to-face time with the patient, as well as:   Follow Up:  In Person prn  Signed,  Brent Bulla, MD  06/30/2020 8:23 AM    [facility] [place]

## 2020-07-12 DIAGNOSIS — J9611 Chronic respiratory failure with hypoxia: Secondary | ICD-10-CM | POA: Diagnosis not present

## 2020-07-12 DIAGNOSIS — J449 Chronic obstructive pulmonary disease, unspecified: Secondary | ICD-10-CM | POA: Diagnosis not present

## 2020-07-14 DIAGNOSIS — J9621 Acute and chronic respiratory failure with hypoxia: Secondary | ICD-10-CM | POA: Diagnosis not present

## 2020-07-14 DIAGNOSIS — G4733 Obstructive sleep apnea (adult) (pediatric): Secondary | ICD-10-CM | POA: Diagnosis not present

## 2020-07-14 DIAGNOSIS — J441 Chronic obstructive pulmonary disease with (acute) exacerbation: Secondary | ICD-10-CM | POA: Diagnosis not present

## 2020-07-14 DIAGNOSIS — R918 Other nonspecific abnormal finding of lung field: Secondary | ICD-10-CM | POA: Diagnosis not present

## 2020-07-15 DIAGNOSIS — J9611 Chronic respiratory failure with hypoxia: Secondary | ICD-10-CM | POA: Diagnosis not present

## 2020-07-15 DIAGNOSIS — J439 Emphysema, unspecified: Secondary | ICD-10-CM | POA: Diagnosis not present

## 2020-07-15 DIAGNOSIS — B965 Pseudomonas (aeruginosa) (mallei) (pseudomallei) as the cause of diseases classified elsewhere: Secondary | ICD-10-CM | POA: Diagnosis not present

## 2020-07-15 DIAGNOSIS — Z1624 Resistance to multiple antibiotics: Secondary | ICD-10-CM | POA: Diagnosis not present

## 2020-07-15 DIAGNOSIS — R0602 Shortness of breath: Secondary | ICD-10-CM | POA: Diagnosis not present

## 2020-07-15 DIAGNOSIS — J9811 Atelectasis: Secondary | ICD-10-CM | POA: Diagnosis not present

## 2020-07-15 DIAGNOSIS — K219 Gastro-esophageal reflux disease without esophagitis: Secondary | ICD-10-CM | POA: Diagnosis not present

## 2020-07-15 DIAGNOSIS — J449 Chronic obstructive pulmonary disease, unspecified: Secondary | ICD-10-CM | POA: Diagnosis not present

## 2020-07-15 DIAGNOSIS — N400 Enlarged prostate without lower urinary tract symptoms: Secondary | ICD-10-CM | POA: Diagnosis not present

## 2020-07-15 DIAGNOSIS — J9620 Acute and chronic respiratory failure, unspecified whether with hypoxia or hypercapnia: Secondary | ICD-10-CM | POA: Diagnosis not present

## 2020-07-15 DIAGNOSIS — J069 Acute upper respiratory infection, unspecified: Secondary | ICD-10-CM | POA: Diagnosis not present

## 2020-07-15 DIAGNOSIS — R845 Abnormal microbiological findings in specimens from respiratory organs and thorax: Secondary | ICD-10-CM | POA: Diagnosis not present

## 2020-07-15 DIAGNOSIS — R059 Cough, unspecified: Secondary | ICD-10-CM | POA: Diagnosis not present

## 2020-07-15 DIAGNOSIS — Z9981 Dependence on supplemental oxygen: Secondary | ICD-10-CM | POA: Diagnosis not present

## 2020-07-15 DIAGNOSIS — D849 Immunodeficiency, unspecified: Secondary | ICD-10-CM | POA: Diagnosis not present

## 2020-07-15 DIAGNOSIS — I119 Hypertensive heart disease without heart failure: Secondary | ICD-10-CM | POA: Diagnosis not present

## 2020-07-16 DIAGNOSIS — D849 Immunodeficiency, unspecified: Secondary | ICD-10-CM | POA: Diagnosis not present

## 2020-07-16 DIAGNOSIS — J9611 Chronic respiratory failure with hypoxia: Secondary | ICD-10-CM | POA: Diagnosis not present

## 2020-07-16 DIAGNOSIS — J9620 Acute and chronic respiratory failure, unspecified whether with hypoxia or hypercapnia: Secondary | ICD-10-CM | POA: Diagnosis not present

## 2020-07-16 DIAGNOSIS — J449 Chronic obstructive pulmonary disease, unspecified: Secondary | ICD-10-CM | POA: Diagnosis not present

## 2020-07-16 DIAGNOSIS — J069 Acute upper respiratory infection, unspecified: Secondary | ICD-10-CM | POA: Diagnosis not present

## 2020-07-16 DIAGNOSIS — R845 Abnormal microbiological findings in specimens from respiratory organs and thorax: Secondary | ICD-10-CM | POA: Diagnosis not present

## 2020-07-16 DIAGNOSIS — Z9981 Dependence on supplemental oxygen: Secondary | ICD-10-CM | POA: Diagnosis not present

## 2020-07-17 DIAGNOSIS — N400 Enlarged prostate without lower urinary tract symptoms: Secondary | ICD-10-CM | POA: Diagnosis not present

## 2020-07-17 DIAGNOSIS — J069 Acute upper respiratory infection, unspecified: Secondary | ICD-10-CM | POA: Diagnosis not present

## 2020-07-17 DIAGNOSIS — K219 Gastro-esophageal reflux disease without esophagitis: Secondary | ICD-10-CM | POA: Diagnosis not present

## 2020-07-17 DIAGNOSIS — I10 Essential (primary) hypertension: Secondary | ICD-10-CM | POA: Diagnosis not present

## 2020-07-17 DIAGNOSIS — C3490 Malignant neoplasm of unspecified part of unspecified bronchus or lung: Secondary | ICD-10-CM | POA: Diagnosis not present

## 2020-07-17 DIAGNOSIS — J439 Emphysema, unspecified: Secondary | ICD-10-CM | POA: Diagnosis not present

## 2020-07-17 DIAGNOSIS — B965 Pseudomonas (aeruginosa) (mallei) (pseudomallei) as the cause of diseases classified elsewhere: Secondary | ICD-10-CM | POA: Diagnosis not present

## 2020-07-17 DIAGNOSIS — J9611 Chronic respiratory failure with hypoxia: Secondary | ICD-10-CM | POA: Diagnosis not present

## 2020-07-17 DIAGNOSIS — M1990 Unspecified osteoarthritis, unspecified site: Secondary | ICD-10-CM | POA: Diagnosis not present

## 2020-07-19 ENCOUNTER — Telehealth: Payer: Self-pay

## 2020-07-19 NOTE — Telephone Encounter (Signed)
Transition Care Management Follow-up Telephone Call    [name] [name]. [birth year]  Admit Date: 07/15/2020 Discharge Date: 07/16/2020 Discharged from where: [facility]  Diagnoses: Upper Respiratory Pseudomonal Infection  2 day post discharge: 07/18/20 7 day post discharge: 07/23/20 14 day post discharge: 07/30/20  [name] [name]. was discharged from [facility] on Friday, 07/16/20 with the diagnoses listed above.  He was contacted today via telephone in regards to transition of care.  Patient with history of lung cancer recently completed chemo.  Patient admitted after several failed rounds of ABT from Dr Blenda Nicely.  Sputum positive for Pseudomonas and started on 14 day course of Cefepime HCL 2 gm IJ TID via port.   -No evidence of PNA. -Continuous oxygen stable on 2L  Discharge Instructions: Complete Cefepine as ordered TID x 13 days after discharge.  Follow-up with PCP in one week  Items Reviewed:  Did the pt receive and understand the discharge instructions provided? Yes   Medications obtained and verified? Yes   Any new allergies since your discharge? No   Dietary orders reviewed? Yes  Do you have support at home? Yes   Home Care and Equipment/Supplies: Were home health services ordered? yes If so, what is the name of the agency? [facility]  Has the agency set up a time to come to the patient's home? yes Were any new equipment or medical supplies ordered?  No  Functional Questionnaire: (I = Independent and D = Dependent) ADLs: I  Bathing/Dressing- I  Meal Prep- I  Eating- I  Maintaining continence- I  Transferring/Ambulation- I  Managing Meds- I   Any patient concerns? no  Follow up appointments reviewed:  PCP Hospital f/u appt confirmed? Yes  Scheduled to see Dr Marina Goodell on 07/21/20 @ 3 pm.  Are transportation arrangements needed? No   If their condition worsens, is the pt aware to call PCP or go to the Emergency Dept.?  Yes  Was the patient provided with contact information for the PCP's office/after hours number? Yes  Was to pt encouraged to call back with questions or concerns? Yes    Creola Corn, LPN 16/10/96 0:45 AM

## 2020-07-20 DIAGNOSIS — C3490 Malignant neoplasm of unspecified part of unspecified bronchus or lung: Secondary | ICD-10-CM | POA: Diagnosis not present

## 2020-07-20 DIAGNOSIS — N400 Enlarged prostate without lower urinary tract symptoms: Secondary | ICD-10-CM | POA: Diagnosis not present

## 2020-07-20 DIAGNOSIS — J069 Acute upper respiratory infection, unspecified: Secondary | ICD-10-CM | POA: Diagnosis not present

## 2020-07-20 DIAGNOSIS — J439 Emphysema, unspecified: Secondary | ICD-10-CM | POA: Diagnosis not present

## 2020-07-20 DIAGNOSIS — I10 Essential (primary) hypertension: Secondary | ICD-10-CM | POA: Diagnosis not present

## 2020-07-20 DIAGNOSIS — J9611 Chronic respiratory failure with hypoxia: Secondary | ICD-10-CM | POA: Diagnosis not present

## 2020-07-20 DIAGNOSIS — Z5181 Encounter for therapeutic drug level monitoring: Secondary | ICD-10-CM | POA: Diagnosis not present

## 2020-07-20 DIAGNOSIS — B965 Pseudomonas (aeruginosa) (mallei) (pseudomallei) as the cause of diseases classified elsewhere: Secondary | ICD-10-CM | POA: Diagnosis not present

## 2020-07-20 DIAGNOSIS — K219 Gastro-esophageal reflux disease without esophagitis: Secondary | ICD-10-CM | POA: Diagnosis not present

## 2020-07-20 DIAGNOSIS — M1990 Unspecified osteoarthritis, unspecified site: Secondary | ICD-10-CM | POA: Diagnosis not present

## 2020-07-21 ENCOUNTER — Encounter: Payer: Self-pay | Admitting: Legal Medicine

## 2020-07-21 ENCOUNTER — Ambulatory Visit (INDEPENDENT_AMBULATORY_CARE_PROVIDER_SITE_OTHER): Payer: Medicare HMO | Admitting: Legal Medicine

## 2020-07-21 ENCOUNTER — Other Ambulatory Visit: Payer: Self-pay

## 2020-07-21 VITALS — BP 110/80 | HR 87 | Temp 97.8°F | Resp 17 | Ht 74.0 in | Wt 143.0 lb

## 2020-07-21 DIAGNOSIS — J439 Emphysema, unspecified: Secondary | ICD-10-CM | POA: Diagnosis not present

## 2020-07-21 DIAGNOSIS — J151 Pneumonia due to Pseudomonas: Secondary | ICD-10-CM | POA: Insufficient documentation

## 2020-07-21 DIAGNOSIS — R110 Nausea: Secondary | ICD-10-CM | POA: Diagnosis not present

## 2020-07-21 DIAGNOSIS — R845 Abnormal microbiological findings in specimens from respiratory organs and thorax: Secondary | ICD-10-CM

## 2020-07-21 DIAGNOSIS — K56609 Unspecified intestinal obstruction, unspecified as to partial versus complete obstruction: Secondary | ICD-10-CM | POA: Diagnosis not present

## 2020-07-21 DIAGNOSIS — I447 Left bundle-branch block, unspecified: Secondary | ICD-10-CM | POA: Diagnosis not present

## 2020-07-21 DIAGNOSIS — R109 Unspecified abdominal pain: Secondary | ICD-10-CM | POA: Diagnosis not present

## 2020-07-21 DIAGNOSIS — J90 Pleural effusion, not elsewhere classified: Secondary | ICD-10-CM | POA: Diagnosis not present

## 2020-07-21 DIAGNOSIS — I82441 Acute embolism and thrombosis of right tibial vein: Secondary | ICD-10-CM

## 2020-07-21 DIAGNOSIS — I82409 Acute embolism and thrombosis of unspecified deep veins of unspecified lower extremity: Secondary | ICD-10-CM | POA: Insufficient documentation

## 2020-07-21 DIAGNOSIS — R1084 Generalized abdominal pain: Secondary | ICD-10-CM | POA: Diagnosis not present

## 2020-07-21 DIAGNOSIS — R1013 Epigastric pain: Secondary | ICD-10-CM | POA: Diagnosis not present

## 2020-07-21 DIAGNOSIS — J9611 Chronic respiratory failure with hypoxia: Secondary | ICD-10-CM

## 2020-07-21 DIAGNOSIS — R112 Nausea with vomiting, unspecified: Secondary | ICD-10-CM | POA: Diagnosis not present

## 2020-07-21 DIAGNOSIS — R1111 Vomiting without nausea: Secondary | ICD-10-CM | POA: Diagnosis not present

## 2020-07-21 DIAGNOSIS — J9610 Chronic respiratory failure, unspecified whether with hypoxia or hypercapnia: Secondary | ICD-10-CM | POA: Insufficient documentation

## 2020-07-21 NOTE — Progress Notes (Signed)
Subjective:  Patient ID: [name] Ill., male    DOB: 24-Oct-1955  Age: 65 y.o. MRN: [id number]  Chief Complaint  Patient presents with  . Transitions Of Care  . Respiratory Failure with hypoxia    HPI: transition of care and reconciliation of medicines.  Patient was admitted to [facility] on 5/19 by dr. Blenda Nicely due to multidrug resistant pseudomonas lung infections.  He was started on cefepine.  He was discharged on 07/16/2020 and getting outpatient infusion of this antibiotic.  He is feeling better, still coughing up phlegm.     Current Outpatient Medications on File Prior to Visit  Medication Sig Dispense Refill  . ALPRAZolam (XANAX) 0.25 MG tablet Take 1 tablet (0.25 mg total) by mouth at bedtime. 30 tablet 2  . CEFEPIME HCL IJ Inject 2 g into the vein 3 (three) times daily. Via port x 14 days total    . Hydroactive Dressings (DUODERM HYDROACTIVE) PSTE Apply 1 application topically 2 (two) times a week. 30 g 3  . ipratropium-albuterol (DUONEB) 0.5-2.5 (3) MG/3ML SOLN Take 3 mLs by nebulization 4 (four) times daily.    Marland Kitchen ipratropium-albuterol (DUONEB) 0.5-2.5 (3) MG/3ML SOLN     . LORazepam (ATIVAN) 1 MG tablet     . omeprazole (PRILOSEC) 40 MG capsule Take 40 mg by mouth daily.    . TRELEGY ELLIPTA 100-62.5-25 MCG/INH AEPB      No current facility-administered medications on file prior to visit.   Past Medical History:  Diagnosis Date  . Asthma    hx of asthma as a child  . 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 COPD (chronic obstructive pulmonary disease) (HCC)   . Esophageal cancer (HCC)   . Hx of cervical spine surgery   . Hypertension   . Lung nodules   . Obstructive sleep apnea   . Personal history of malignant neoplasm of esophagus 08/18/2019  . Pulmonary embolism [facility])    Past Surgical History:  Procedure Laterality Date  . CERVICAL SPINE SURGERY    . EUS  08/03/2011   Procedure: UPPER ENDOSCOPIC ULTRASOUND (EUS) LINEAR;  Surgeon: Rachael Fee, MD;   Location: [facility];  Service: Endoscopy;  Laterality: N/A;  . IVC FILTER PLACEMENT ([facility] HX)  04/03/2016  . small clot in right lung  04/10/2013  . stomach pull through surgery 2013  2013      . TONSILLECTOMY      Family History  Problem Relation Age of Onset  . Hypertension Father   . COPD Father   . CAD Father   . Pancreatic cancer Sister   . Hypertension Mother    Social History   Socioeconomic History  . Marital status: Married    Spouse name: Not on file  . Number of children: Not on file  . Years of education: Not on file  . Highest education level: Not on file  Occupational History  . Not on file  Tobacco Use  . Smoking status: Former Smoker    Packs/day: 1.00    Years: 42.00    Pack years: 42.00    Types: Cigarettes    Quit date: 03/04/2014    Years since quitting: 6.3  . Smokeless tobacco: Never Used  Vaping Use  . Vaping Use: Unknown  Substance and Sexual Activity  . Alcohol use: No  . Drug use: Not Currently    Types: Marijuana  . Sexual activity: Yes    Partners: Female    Birth control/protection: None  Other Topics Concern  . Not on file  Social History Narrative   Lives with spouse   Social Determinants of Health   Financial Resource Strain: Not on file  Food Insecurity: Not on file  Transportation Needs: Not on file  Physical Activity: Not on file  Stress: Not on file  Social Connections: Not on file    Review of Systems  Constitutional: Negative for activity change and appetite change.  HENT: Negative for congestion and rhinorrhea.   Eyes: Negative for visual disturbance.  Respiratory: Positive for cough.   Gastrointestinal: Negative for abdominal distention and abdominal pain.  Genitourinary: Negative for urgency.  Musculoskeletal: Negative for arthralgias and back pain.  Neurological: Negative.   Psychiatric/Behavioral: Negative.      Objective:  BP 110/80   Pulse 87   Temp 97.8 F (36.6 C)   Resp 17   Ht 6\' 2"  (1.88  m)   Wt 143 lb (64.9 kg)   SpO2 90%   BMI 18.36 kg/m   BP/Weight 07/21/2020 06/30/2020 06/16/2020  Systolic BP 110 - 153  Diastolic BP 80 - 78  Wt. (Lbs) 143 144 147.9  BMI 18.36 18.49 18.99    Physical Exam Vitals reviewed.  Constitutional:      Appearance: Normal appearance.  HENT:     Right Ear: Tympanic membrane normal.     Left Ear: Tympanic membrane normal.  Eyes:     Extraocular Movements: Extraocular movements intact.     Conjunctiva/sclera: Conjunctivae normal.     Pupils: Pupils are equal, round, and reactive to light.  Cardiovascular:     Rate and Rhythm: Normal rate and regular rhythm.     Pulses: Normal pulses.     Heart sounds: No murmur heard. No gallop.   Pulmonary:     Effort: Pulmonary effort is normal. No respiratory distress.     Breath sounds: No rales.  Abdominal:     General: Abdomen is flat. Bowel sounds are normal.     Palpations: Abdomen is soft.  Musculoskeletal:        General: Tenderness (right calf) present.     Cervical back: Normal range of motion.  Skin:    General: Skin is dry.     Capillary Refill: Capillary refill takes less than 2 seconds.  Neurological:     General: No focal deficit present.     Mental Status: He is alert. Mental status is at baseline.       Lab Results  Component Value Date   WBC 8.3 03/10/2020   HGB 11.6 (L) 03/10/2020   HCT 35.4 (L) 03/10/2020   PLT 243 03/10/2020   GLUCOSE 121 (H) 03/10/2020   ALT 10 03/10/2020   AST 14 03/10/2020   NA 146 (H) 03/10/2020   K 4.5 03/10/2020   CL 103 03/10/2020   CREATININE 0.99 03/10/2020   BUN 10 03/10/2020   CO2 29 03/10/2020   TSH 2.440 10/28/2019   HGBA1C 5.4 03/10/2020      Assessment & Plan:   1. Abnormal microbiological findings in specimens from respiratory organs and thorax Patient has pseudomons in lung  2. Chronic respiratory failure with hypoxia [facility]) Patient needs to continue nebulizers, his present one in broken.  Reorder nebulizer  3.  Pneumonia due to Pseudomonas species, unspecified laterality, unspecified part of lung (HCC) Patient is improving with IV antibiotics but remains dyspneic  4. Acute deep vein thrombosis (DVT) of tibial vein of right lower extremity (HCC) - US Venous Img Lower Unilateral  Right Set up venus dopplers     Orders Placed This Encounter  Procedures  . US Venous Img Lower Unilateral Right     Follow-up: Return in about 3 months (around 10/21/2020).  An After Visit Summary was printed and given to the patient.  Brent Bulla, MD [facility] [phone]

## 2020-07-22 ENCOUNTER — Telehealth: Payer: Self-pay | Admitting: Legal Medicine

## 2020-07-22 DIAGNOSIS — K56601 Complete intestinal obstruction, unspecified as to cause: Secondary | ICD-10-CM | POA: Diagnosis not present

## 2020-07-22 DIAGNOSIS — I10 Essential (primary) hypertension: Secondary | ICD-10-CM | POA: Diagnosis not present

## 2020-07-22 DIAGNOSIS — J9601 Acute respiratory failure with hypoxia: Secondary | ICD-10-CM | POA: Diagnosis not present

## 2020-07-22 DIAGNOSIS — J690 Pneumonitis due to inhalation of food and vomit: Secondary | ICD-10-CM | POA: Diagnosis not present

## 2020-07-22 DIAGNOSIS — K55011 Focal (segmental) acute (reversible) ischemia of small intestine: Secondary | ICD-10-CM | POA: Diagnosis not present

## 2020-07-22 DIAGNOSIS — K565 Intestinal adhesions [bands], unspecified as to partial versus complete obstruction: Secondary | ICD-10-CM | POA: Diagnosis not present

## 2020-07-22 DIAGNOSIS — K558 Other vascular disorders of intestine: Secondary | ICD-10-CM | POA: Diagnosis not present

## 2020-07-22 DIAGNOSIS — J9620 Acute and chronic respiratory failure, unspecified whether with hypoxia or hypercapnia: Secondary | ICD-10-CM | POA: Diagnosis not present

## 2020-07-22 DIAGNOSIS — R531 Weakness: Secondary | ICD-10-CM | POA: Diagnosis not present

## 2020-07-22 DIAGNOSIS — R000 Tachycardia, unspecified: Secondary | ICD-10-CM | POA: Diagnosis not present

## 2020-07-22 DIAGNOSIS — K5939 Other megacolon: Secondary | ICD-10-CM | POA: Diagnosis not present

## 2020-07-22 DIAGNOSIS — I361 Nonrheumatic tricuspid (valve) insufficiency: Secondary | ICD-10-CM | POA: Diagnosis not present

## 2020-07-22 DIAGNOSIS — R509 Fever, unspecified: Secondary | ICD-10-CM | POA: Diagnosis not present

## 2020-07-22 DIAGNOSIS — M6250 Muscle wasting and atrophy, not elsewhere classified, unspecified site: Secondary | ICD-10-CM | POA: Diagnosis not present

## 2020-07-22 DIAGNOSIS — R140 Abdominal distension (gaseous): Secondary | ICD-10-CM | POA: Diagnosis not present

## 2020-07-22 DIAGNOSIS — G929 Unspecified toxic encephalopathy: Secondary | ICD-10-CM | POA: Diagnosis not present

## 2020-07-22 DIAGNOSIS — J441 Chronic obstructive pulmonary disease with (acute) exacerbation: Secondary | ICD-10-CM | POA: Diagnosis not present

## 2020-07-22 DIAGNOSIS — K6389 Other specified diseases of intestine: Secondary | ICD-10-CM | POA: Diagnosis not present

## 2020-07-22 DIAGNOSIS — J156 Pneumonia due to other aerobic Gram-negative bacteria: Secondary | ICD-10-CM | POA: Diagnosis not present

## 2020-07-22 DIAGNOSIS — J9621 Acute and chronic respiratory failure with hypoxia: Secondary | ICD-10-CM | POA: Diagnosis not present

## 2020-07-22 DIAGNOSIS — G9341 Metabolic encephalopathy: Secondary | ICD-10-CM | POA: Diagnosis not present

## 2020-07-22 DIAGNOSIS — L89154 Pressure ulcer of sacral region, stage 4: Secondary | ICD-10-CM | POA: Diagnosis not present

## 2020-07-22 DIAGNOSIS — J189 Pneumonia, unspecified organism: Secondary | ICD-10-CM | POA: Diagnosis not present

## 2020-07-22 DIAGNOSIS — J069 Acute upper respiratory infection, unspecified: Secondary | ICD-10-CM | POA: Diagnosis not present

## 2020-07-22 DIAGNOSIS — R1013 Epigastric pain: Secondary | ICD-10-CM | POA: Diagnosis not present

## 2020-07-22 DIAGNOSIS — K9423 Gastrostomy malfunction: Secondary | ICD-10-CM | POA: Diagnosis not present

## 2020-07-22 DIAGNOSIS — E43 Unspecified severe protein-calorie malnutrition: Secondary | ICD-10-CM | POA: Diagnosis not present

## 2020-07-22 DIAGNOSIS — J90 Pleural effusion, not elsewhere classified: Secondary | ICD-10-CM | POA: Diagnosis not present

## 2020-07-22 DIAGNOSIS — Z681 Body mass index (BMI) 19 or less, adult: Secondary | ICD-10-CM | POA: Diagnosis not present

## 2020-07-22 DIAGNOSIS — Z4682 Encounter for fitting and adjustment of non-vascular catheter: Secondary | ICD-10-CM | POA: Diagnosis not present

## 2020-07-22 DIAGNOSIS — N170 Acute kidney failure with tubular necrosis: Secondary | ICD-10-CM | POA: Diagnosis not present

## 2020-07-22 DIAGNOSIS — K56609 Unspecified intestinal obstruction, unspecified as to partial versus complete obstruction: Secondary | ICD-10-CM | POA: Diagnosis not present

## 2020-07-22 DIAGNOSIS — R5381 Other malaise: Secondary | ICD-10-CM | POA: Diagnosis not present

## 2020-07-22 DIAGNOSIS — Z743 Need for continuous supervision: Secondary | ICD-10-CM | POA: Diagnosis not present

## 2020-07-22 DIAGNOSIS — J9690 Respiratory failure, unspecified, unspecified whether with hypoxia or hypercapnia: Secondary | ICD-10-CM | POA: Diagnosis not present

## 2020-07-22 DIAGNOSIS — Z9889 Other specified postprocedural states: Secondary | ICD-10-CM | POA: Diagnosis not present

## 2020-07-22 DIAGNOSIS — T17800A Unspecified foreign body in other parts of respiratory tract causing asphyxiation, initial encounter: Secondary | ICD-10-CM | POA: Diagnosis not present

## 2020-07-22 DIAGNOSIS — A419 Sepsis, unspecified organism: Secondary | ICD-10-CM | POA: Diagnosis not present

## 2020-07-22 DIAGNOSIS — R0902 Hypoxemia: Secondary | ICD-10-CM | POA: Diagnosis not present

## 2020-07-22 DIAGNOSIS — F331 Major depressive disorder, recurrent, moderate: Secondary | ICD-10-CM | POA: Diagnosis not present

## 2020-07-22 DIAGNOSIS — J9811 Atelectasis: Secondary | ICD-10-CM | POA: Diagnosis not present

## 2020-07-22 DIAGNOSIS — J439 Emphysema, unspecified: Secondary | ICD-10-CM | POA: Diagnosis not present

## 2020-07-22 DIAGNOSIS — Z85118 Personal history of other malignant neoplasm of bronchus and lung: Secondary | ICD-10-CM | POA: Diagnosis not present

## 2020-07-22 DIAGNOSIS — K5651 Intestinal adhesions [bands], with partial obstruction: Secondary | ICD-10-CM | POA: Diagnosis not present

## 2020-07-22 DIAGNOSIS — J449 Chronic obstructive pulmonary disease, unspecified: Secondary | ICD-10-CM | POA: Diagnosis not present

## 2020-07-22 DIAGNOSIS — Z9911 Dependence on respirator [ventilator] status: Secondary | ICD-10-CM | POA: Diagnosis not present

## 2020-07-22 DIAGNOSIS — K5652 Intestinal adhesions [bands] with complete obstruction: Secondary | ICD-10-CM | POA: Diagnosis not present

## 2020-07-22 DIAGNOSIS — J9622 Acute and chronic respiratory failure with hypercapnia: Secondary | ICD-10-CM | POA: Diagnosis not present

## 2020-07-22 DIAGNOSIS — J151 Pneumonia due to Pseudomonas: Secondary | ICD-10-CM | POA: Diagnosis not present

## 2020-07-22 DIAGNOSIS — R109 Unspecified abdominal pain: Secondary | ICD-10-CM | POA: Diagnosis not present

## 2020-07-22 DIAGNOSIS — Z9989 Dependence on other enabling machines and devices: Secondary | ICD-10-CM | POA: Diagnosis not present

## 2020-07-22 DIAGNOSIS — R6521 Severe sepsis with septic shock: Secondary | ICD-10-CM | POA: Diagnosis not present

## 2020-07-22 DIAGNOSIS — E46 Unspecified protein-calorie malnutrition: Secondary | ICD-10-CM | POA: Diagnosis not present

## 2020-07-22 DIAGNOSIS — J9611 Chronic respiratory failure with hypoxia: Secondary | ICD-10-CM | POA: Diagnosis not present

## 2020-07-22 DIAGNOSIS — J80 Acute respiratory distress syndrome: Secondary | ICD-10-CM | POA: Diagnosis not present

## 2020-07-22 NOTE — Progress Notes (Signed)
Chronic Care Management   Outreach Note  07/22/2020 Name: Saim Mcquary. MRN: [id number] DOB: Jan 23, 1956  Referred by: Abigail Miyamoto, MD Reason for referral : No chief complaint on file.   An unsuccessful telephone outreach was attempted today. The patient was referred to the pharmacist for assistance with care management and care coordination.   Follow Up Plan:   Tatjana Dellinger Upstream Scheduler

## 2020-07-23 ENCOUNTER — Other Ambulatory Visit: Payer: Self-pay

## 2020-07-23 DIAGNOSIS — J9611 Chronic respiratory failure with hypoxia: Secondary | ICD-10-CM | POA: Diagnosis not present

## 2020-07-23 DIAGNOSIS — J90 Pleural effusion, not elsewhere classified: Secondary | ICD-10-CM | POA: Diagnosis not present

## 2020-07-23 DIAGNOSIS — J9690 Respiratory failure, unspecified, unspecified whether with hypoxia or hypercapnia: Secondary | ICD-10-CM | POA: Diagnosis not present

## 2020-07-23 DIAGNOSIS — J439 Emphysema, unspecified: Secondary | ICD-10-CM | POA: Diagnosis not present

## 2020-07-23 DIAGNOSIS — I10 Essential (primary) hypertension: Secondary | ICD-10-CM

## 2020-07-23 DIAGNOSIS — J449 Chronic obstructive pulmonary disease, unspecified: Secondary | ICD-10-CM

## 2020-07-24 DIAGNOSIS — J90 Pleural effusion, not elsewhere classified: Secondary | ICD-10-CM | POA: Diagnosis not present

## 2020-07-24 DIAGNOSIS — J9690 Respiratory failure, unspecified, unspecified whether with hypoxia or hypercapnia: Secondary | ICD-10-CM | POA: Diagnosis not present

## 2020-07-26 DIAGNOSIS — J449 Chronic obstructive pulmonary disease, unspecified: Secondary | ICD-10-CM | POA: Diagnosis not present

## 2020-07-26 DIAGNOSIS — J9811 Atelectasis: Secondary | ICD-10-CM | POA: Diagnosis not present

## 2020-07-27 ENCOUNTER — Telehealth: Payer: Self-pay | Admitting: *Deleted

## 2020-07-27 DIAGNOSIS — I361 Nonrheumatic tricuspid (valve) insufficiency: Secondary | ICD-10-CM

## 2020-07-27 DIAGNOSIS — J439 Emphysema, unspecified: Secondary | ICD-10-CM | POA: Diagnosis not present

## 2020-07-27 DIAGNOSIS — Z4682 Encounter for fitting and adjustment of non-vascular catheter: Secondary | ICD-10-CM | POA: Diagnosis not present

## 2020-07-27 NOTE — Chronic Care Management (AMB) (Signed)
Chronic Care Management   Outreach Note  07/27/2020 Name: [name] [name]. MRN: [id number] DOB: [birth year]  [name] Deren. is a 65 y.o. year old male who is a primary care patient of Abigail Miyamoto, MD. I reached out to [name] [name]. by phone today in response to a referral sent by Mr. [name] Lumpkin Jr.'s PCP, Dr. Marina Goodell.     An unsuccessful telephone outreach was attempted today. Patient is currently in hospital. The patient was referred to the case management team for assistance with care management and care coordination.   Follow Up Plan: The care management team will reach out to the patient again over the next 7 days. If patient returns call to provider office, please advise to call Embedded Care Management Care Guide Gwenevere Ghazi at [phone].  Gwenevere Ghazi  Care Guide, Embedded Care Coordination [facility] Management

## 2020-07-28 DIAGNOSIS — K6389 Other specified diseases of intestine: Secondary | ICD-10-CM | POA: Diagnosis not present

## 2020-07-28 DIAGNOSIS — K56609 Unspecified intestinal obstruction, unspecified as to partial versus complete obstruction: Secondary | ICD-10-CM | POA: Diagnosis not present

## 2020-07-28 DIAGNOSIS — K5939 Other megacolon: Secondary | ICD-10-CM | POA: Diagnosis not present

## 2020-07-28 DIAGNOSIS — J9690 Respiratory failure, unspecified, unspecified whether with hypoxia or hypercapnia: Secondary | ICD-10-CM | POA: Diagnosis not present

## 2020-07-29 DIAGNOSIS — J439 Emphysema, unspecified: Secondary | ICD-10-CM | POA: Diagnosis not present

## 2020-07-29 DIAGNOSIS — K55011 Focal (segmental) acute (reversible) ischemia of small intestine: Secondary | ICD-10-CM | POA: Diagnosis not present

## 2020-07-29 DIAGNOSIS — J449 Chronic obstructive pulmonary disease, unspecified: Secondary | ICD-10-CM | POA: Diagnosis not present

## 2020-07-29 DIAGNOSIS — T17800A Unspecified foreign body in other parts of respiratory tract causing asphyxiation, initial encounter: Secondary | ICD-10-CM | POA: Diagnosis not present

## 2020-07-29 DIAGNOSIS — J9620 Acute and chronic respiratory failure, unspecified whether with hypoxia or hypercapnia: Secondary | ICD-10-CM | POA: Diagnosis not present

## 2020-07-29 DIAGNOSIS — A419 Sepsis, unspecified organism: Secondary | ICD-10-CM | POA: Diagnosis not present

## 2020-07-29 DIAGNOSIS — J9690 Respiratory failure, unspecified, unspecified whether with hypoxia or hypercapnia: Secondary | ICD-10-CM | POA: Diagnosis not present

## 2020-07-29 DIAGNOSIS — E46 Unspecified protein-calorie malnutrition: Secondary | ICD-10-CM | POA: Diagnosis not present

## 2020-07-29 DIAGNOSIS — Z9911 Dependence on respirator [ventilator] status: Secondary | ICD-10-CM | POA: Diagnosis not present

## 2020-07-29 DIAGNOSIS — Z85118 Personal history of other malignant neoplasm of bronchus and lung: Secondary | ICD-10-CM | POA: Diagnosis not present

## 2020-07-30 DIAGNOSIS — Z9911 Dependence on respirator [ventilator] status: Secondary | ICD-10-CM | POA: Diagnosis not present

## 2020-07-30 DIAGNOSIS — T17800A Unspecified foreign body in other parts of respiratory tract causing asphyxiation, initial encounter: Secondary | ICD-10-CM | POA: Diagnosis not present

## 2020-07-30 DIAGNOSIS — A419 Sepsis, unspecified organism: Secondary | ICD-10-CM | POA: Diagnosis not present

## 2020-07-30 DIAGNOSIS — J9620 Acute and chronic respiratory failure, unspecified whether with hypoxia or hypercapnia: Secondary | ICD-10-CM | POA: Diagnosis not present

## 2020-07-30 DIAGNOSIS — J9690 Respiratory failure, unspecified, unspecified whether with hypoxia or hypercapnia: Secondary | ICD-10-CM | POA: Diagnosis not present

## 2020-07-30 DIAGNOSIS — J449 Chronic obstructive pulmonary disease, unspecified: Secondary | ICD-10-CM | POA: Diagnosis not present

## 2020-07-31 DIAGNOSIS — R0902 Hypoxemia: Secondary | ICD-10-CM | POA: Diagnosis not present

## 2020-07-31 DIAGNOSIS — J9811 Atelectasis: Secondary | ICD-10-CM | POA: Diagnosis not present

## 2020-07-31 DIAGNOSIS — J90 Pleural effusion, not elsewhere classified: Secondary | ICD-10-CM | POA: Diagnosis not present

## 2020-08-02 DIAGNOSIS — A419 Sepsis, unspecified organism: Secondary | ICD-10-CM | POA: Diagnosis not present

## 2020-08-02 DIAGNOSIS — J439 Emphysema, unspecified: Secondary | ICD-10-CM | POA: Diagnosis not present

## 2020-08-02 DIAGNOSIS — Z9911 Dependence on respirator [ventilator] status: Secondary | ICD-10-CM | POA: Diagnosis not present

## 2020-08-02 DIAGNOSIS — R509 Fever, unspecified: Secondary | ICD-10-CM | POA: Diagnosis not present

## 2020-08-02 DIAGNOSIS — J9620 Acute and chronic respiratory failure, unspecified whether with hypoxia or hypercapnia: Secondary | ICD-10-CM | POA: Diagnosis not present

## 2020-08-02 DIAGNOSIS — J449 Chronic obstructive pulmonary disease, unspecified: Secondary | ICD-10-CM | POA: Diagnosis not present

## 2020-08-02 DIAGNOSIS — T17800A Unspecified foreign body in other parts of respiratory tract causing asphyxiation, initial encounter: Secondary | ICD-10-CM | POA: Diagnosis not present

## 2020-08-02 DIAGNOSIS — Z4682 Encounter for fitting and adjustment of non-vascular catheter: Secondary | ICD-10-CM | POA: Diagnosis not present

## 2020-08-03 DIAGNOSIS — A419 Sepsis, unspecified organism: Secondary | ICD-10-CM | POA: Diagnosis not present

## 2020-08-03 DIAGNOSIS — Z9911 Dependence on respirator [ventilator] status: Secondary | ICD-10-CM | POA: Diagnosis not present

## 2020-08-03 DIAGNOSIS — J9620 Acute and chronic respiratory failure, unspecified whether with hypoxia or hypercapnia: Secondary | ICD-10-CM | POA: Diagnosis not present

## 2020-08-03 DIAGNOSIS — T17800A Unspecified foreign body in other parts of respiratory tract causing asphyxiation, initial encounter: Secondary | ICD-10-CM | POA: Diagnosis not present

## 2020-08-03 DIAGNOSIS — J9690 Respiratory failure, unspecified, unspecified whether with hypoxia or hypercapnia: Secondary | ICD-10-CM | POA: Diagnosis not present

## 2020-08-03 DIAGNOSIS — J449 Chronic obstructive pulmonary disease, unspecified: Secondary | ICD-10-CM | POA: Diagnosis not present

## 2020-08-04 DIAGNOSIS — T17800A Unspecified foreign body in other parts of respiratory tract causing asphyxiation, initial encounter: Secondary | ICD-10-CM | POA: Diagnosis not present

## 2020-08-04 DIAGNOSIS — Z9911 Dependence on respirator [ventilator] status: Secondary | ICD-10-CM | POA: Diagnosis not present

## 2020-08-04 DIAGNOSIS — J9690 Respiratory failure, unspecified, unspecified whether with hypoxia or hypercapnia: Secondary | ICD-10-CM | POA: Diagnosis not present

## 2020-08-04 DIAGNOSIS — J9620 Acute and chronic respiratory failure, unspecified whether with hypoxia or hypercapnia: Secondary | ICD-10-CM | POA: Diagnosis not present

## 2020-08-04 DIAGNOSIS — A419 Sepsis, unspecified organism: Secondary | ICD-10-CM | POA: Diagnosis not present

## 2020-08-04 DIAGNOSIS — J449 Chronic obstructive pulmonary disease, unspecified: Secondary | ICD-10-CM | POA: Diagnosis not present

## 2020-08-04 NOTE — Chronic Care Management (AMB) (Signed)
Chronic Care Management   Outreach Note  08/04/2020 Name: [name] [name]. MRN: [id number] DOB: 08-Oct-1955  [name] Barling. is a 65 y.o. year old male who is a primary care patient of Abigail Miyamoto, MD. I reached out to [name] Ill. by phone today in response to a referral sent by Mr. [name] Pomrenke Jr.'s PCP, Dr. Marina Goodell.     A second unsuccessful telephone outreach was attempted today. The patient was referred to the case management team for assistance with care management and care coordination.   Follow Up Plan: A HIPAA compliant phone message was left for the patient providing contact information and requesting a return call.  The care management team will reach out to the patient again over the next 7 days. If patient returns call to provider office, please advise to call Embedded Care Management Care Guide Gwenevere Ghazi at [phone].  Gwenevere Ghazi  Care Guide, Embedded Care Coordination [facility] Management

## 2020-08-05 DIAGNOSIS — T17800A Unspecified foreign body in other parts of respiratory tract causing asphyxiation, initial encounter: Secondary | ICD-10-CM | POA: Diagnosis not present

## 2020-08-05 DIAGNOSIS — A419 Sepsis, unspecified organism: Secondary | ICD-10-CM | POA: Diagnosis not present

## 2020-08-05 DIAGNOSIS — J9690 Respiratory failure, unspecified, unspecified whether with hypoxia or hypercapnia: Secondary | ICD-10-CM | POA: Diagnosis not present

## 2020-08-05 DIAGNOSIS — J9811 Atelectasis: Secondary | ICD-10-CM | POA: Diagnosis not present

## 2020-08-05 DIAGNOSIS — J90 Pleural effusion, not elsewhere classified: Secondary | ICD-10-CM | POA: Diagnosis not present

## 2020-08-05 DIAGNOSIS — R0902 Hypoxemia: Secondary | ICD-10-CM | POA: Diagnosis not present

## 2020-08-05 DIAGNOSIS — J9620 Acute and chronic respiratory failure, unspecified whether with hypoxia or hypercapnia: Secondary | ICD-10-CM | POA: Diagnosis not present

## 2020-08-05 DIAGNOSIS — J449 Chronic obstructive pulmonary disease, unspecified: Secondary | ICD-10-CM | POA: Diagnosis not present

## 2020-08-05 DIAGNOSIS — Z9911 Dependence on respirator [ventilator] status: Secondary | ICD-10-CM | POA: Diagnosis not present

## 2020-08-06 DIAGNOSIS — J90 Pleural effusion, not elsewhere classified: Secondary | ICD-10-CM | POA: Diagnosis not present

## 2020-08-06 DIAGNOSIS — J9811 Atelectasis: Secondary | ICD-10-CM | POA: Diagnosis not present

## 2020-08-06 DIAGNOSIS — J9690 Respiratory failure, unspecified, unspecified whether with hypoxia or hypercapnia: Secondary | ICD-10-CM | POA: Diagnosis not present

## 2020-08-06 DIAGNOSIS — R0902 Hypoxemia: Secondary | ICD-10-CM | POA: Diagnosis not present

## 2020-08-06 NOTE — Chronic Care Management (AMB) (Signed)
Chronic Care Management   Outreach Note  08/06/2020 Name: [name] [name]. MRN: [id number] DOB: 10-08-1955  [name] [name]. is a 65 y.o. year old male who is a primary care patient of Abigail Miyamoto, MD. I reached out to [name] [name]. by phone today in response to a referral sent by Mr. Mcguire Dresel Jr.'s PCP, Dr. Marina Goodell.      Third unsuccessful telephone outreach was attempted today. The patient was referred to the case management team for assistance with care management and care coordination. The patient's primary care provider has been notified of our unsuccessful attempts to make or maintain contact with the patient. The care management team is pleased to engage with this patient at any time in the future should he/she be interested in assistance from the care management team.   Follow Up Plan: We have been unable to make contact with the patient. The care management team is available to follow up with the patient after provider conversation with the patient regarding recommendation for care management engagement and subsequent re-referral to the care management team. If patient returns call to provider office, please advise to call Embedded Care Management Care Guide Gwenevere Ghazi  at [phone]  [facility] Guide, Embedded Care Coordination [facility]  Care Management

## 2020-08-09 ENCOUNTER — Encounter: Payer: Self-pay | Admitting: Legal Medicine

## 2020-08-19 DIAGNOSIS — J441 Chronic obstructive pulmonary disease with (acute) exacerbation: Secondary | ICD-10-CM | POA: Diagnosis not present

## 2020-08-19 DIAGNOSIS — R1310 Dysphagia, unspecified: Secondary | ICD-10-CM | POA: Diagnosis not present

## 2020-08-19 DIAGNOSIS — Z9989 Dependence on other enabling machines and devices: Secondary | ICD-10-CM | POA: Diagnosis not present

## 2020-08-19 DIAGNOSIS — K9423 Gastrostomy malfunction: Secondary | ICD-10-CM | POA: Diagnosis not present

## 2020-08-19 DIAGNOSIS — E440 Moderate protein-calorie malnutrition: Secondary | ICD-10-CM | POA: Diagnosis not present

## 2020-08-19 DIAGNOSIS — C4650 Kaposi's sarcoma of unspecified lung: Secondary | ICD-10-CM | POA: Diagnosis not present

## 2020-08-19 DIAGNOSIS — E43 Unspecified severe protein-calorie malnutrition: Secondary | ICD-10-CM | POA: Diagnosis not present

## 2020-08-19 DIAGNOSIS — G9340 Encephalopathy, unspecified: Secondary | ICD-10-CM | POA: Diagnosis not present

## 2020-08-19 DIAGNOSIS — R1311 Dysphagia, oral phase: Secondary | ICD-10-CM | POA: Diagnosis not present

## 2020-08-19 DIAGNOSIS — G8929 Other chronic pain: Secondary | ICD-10-CM | POA: Diagnosis not present

## 2020-08-19 DIAGNOSIS — J156 Pneumonia due to other aerobic Gram-negative bacteria: Secondary | ICD-10-CM | POA: Diagnosis not present

## 2020-08-19 DIAGNOSIS — F419 Anxiety disorder, unspecified: Secondary | ICD-10-CM | POA: Diagnosis not present

## 2020-08-19 DIAGNOSIS — R000 Tachycardia, unspecified: Secondary | ICD-10-CM | POA: Diagnosis not present

## 2020-08-19 DIAGNOSIS — J690 Pneumonitis due to inhalation of food and vomit: Secondary | ICD-10-CM | POA: Diagnosis not present

## 2020-08-19 DIAGNOSIS — N183 Chronic kidney disease, stage 3 unspecified: Secondary | ICD-10-CM | POA: Diagnosis not present

## 2020-08-19 DIAGNOSIS — J151 Pneumonia due to Pseudomonas: Secondary | ICD-10-CM | POA: Diagnosis not present

## 2020-08-19 DIAGNOSIS — M6281 Muscle weakness (generalized): Secondary | ICD-10-CM | POA: Diagnosis not present

## 2020-08-19 DIAGNOSIS — Z930 Tracheostomy status: Secondary | ICD-10-CM | POA: Diagnosis not present

## 2020-08-19 DIAGNOSIS — J387 Other diseases of larynx: Secondary | ICD-10-CM | POA: Diagnosis not present

## 2020-08-19 DIAGNOSIS — D72829 Elevated white blood cell count, unspecified: Secondary | ICD-10-CM | POA: Diagnosis not present

## 2020-08-19 DIAGNOSIS — C159 Malignant neoplasm of esophagus, unspecified: Secondary | ICD-10-CM | POA: Diagnosis not present

## 2020-08-19 DIAGNOSIS — C3490 Malignant neoplasm of unspecified part of unspecified bronchus or lung: Secondary | ICD-10-CM | POA: Diagnosis not present

## 2020-08-19 DIAGNOSIS — E45 Retarded development following protein-calorie malnutrition: Secondary | ICD-10-CM | POA: Diagnosis not present

## 2020-08-19 DIAGNOSIS — J9601 Acute respiratory failure with hypoxia: Secondary | ICD-10-CM | POA: Diagnosis not present

## 2020-08-19 DIAGNOSIS — R531 Weakness: Secondary | ICD-10-CM | POA: Diagnosis not present

## 2020-08-19 DIAGNOSIS — Z681 Body mass index (BMI) 19 or less, adult: Secondary | ICD-10-CM | POA: Diagnosis not present

## 2020-08-19 DIAGNOSIS — J9611 Chronic respiratory failure with hypoxia: Secondary | ICD-10-CM | POA: Diagnosis not present

## 2020-08-19 DIAGNOSIS — G928 Other toxic encephalopathy: Secondary | ICD-10-CM | POA: Diagnosis not present

## 2020-08-19 DIAGNOSIS — F1120 Opioid dependence, uncomplicated: Secondary | ICD-10-CM | POA: Diagnosis not present

## 2020-08-19 DIAGNOSIS — L89154 Pressure ulcer of sacral region, stage 4: Secondary | ICD-10-CM | POA: Diagnosis not present

## 2020-08-19 DIAGNOSIS — J449 Chronic obstructive pulmonary disease, unspecified: Secondary | ICD-10-CM | POA: Diagnosis not present

## 2020-08-19 DIAGNOSIS — G8928 Other chronic postprocedural pain: Secondary | ICD-10-CM | POA: Diagnosis not present

## 2020-08-19 DIAGNOSIS — A419 Sepsis, unspecified organism: Secondary | ICD-10-CM | POA: Diagnosis not present

## 2020-08-19 DIAGNOSIS — R5381 Other malaise: Secondary | ICD-10-CM | POA: Diagnosis not present

## 2020-08-19 DIAGNOSIS — J9622 Acute and chronic respiratory failure with hypercapnia: Secondary | ICD-10-CM | POA: Diagnosis not present

## 2020-08-19 DIAGNOSIS — R54 Age-related physical debility: Secondary | ICD-10-CM | POA: Diagnosis not present

## 2020-08-19 DIAGNOSIS — R509 Fever, unspecified: Secondary | ICD-10-CM | POA: Diagnosis not present

## 2020-08-19 DIAGNOSIS — G894 Chronic pain syndrome: Secondary | ICD-10-CM | POA: Diagnosis not present

## 2020-08-19 DIAGNOSIS — J9621 Acute and chronic respiratory failure with hypoxia: Secondary | ICD-10-CM | POA: Diagnosis not present

## 2020-08-19 DIAGNOSIS — Z430 Encounter for attention to tracheostomy: Secondary | ICD-10-CM | POA: Diagnosis not present

## 2020-08-19 DIAGNOSIS — N189 Chronic kidney disease, unspecified: Secondary | ICD-10-CM | POA: Diagnosis not present

## 2020-08-19 DIAGNOSIS — N179 Acute kidney failure, unspecified: Secondary | ICD-10-CM | POA: Diagnosis not present

## 2020-08-19 DIAGNOSIS — J189 Pneumonia, unspecified organism: Secondary | ICD-10-CM | POA: Diagnosis not present

## 2020-08-19 DIAGNOSIS — D649 Anemia, unspecified: Secondary | ICD-10-CM | POA: Diagnosis not present

## 2020-08-19 DIAGNOSIS — Z7401 Bed confinement status: Secondary | ICD-10-CM | POA: Diagnosis not present

## 2020-08-19 DIAGNOSIS — L89159 Pressure ulcer of sacral region, unspecified stage: Secondary | ICD-10-CM | POA: Diagnosis not present

## 2020-08-19 DIAGNOSIS — R6521 Severe sepsis with septic shock: Secondary | ICD-10-CM | POA: Diagnosis not present

## 2020-08-19 DIAGNOSIS — J398 Other specified diseases of upper respiratory tract: Secondary | ICD-10-CM | POA: Diagnosis not present

## 2020-08-19 DIAGNOSIS — G9341 Metabolic encephalopathy: Secondary | ICD-10-CM | POA: Diagnosis not present

## 2020-08-19 DIAGNOSIS — J9620 Acute and chronic respiratory failure, unspecified whether with hypoxia or hypercapnia: Secondary | ICD-10-CM | POA: Diagnosis not present

## 2020-08-19 DIAGNOSIS — G929 Unspecified toxic encephalopathy: Secondary | ICD-10-CM | POA: Diagnosis not present

## 2020-08-19 DIAGNOSIS — R262 Difficulty in walking, not elsewhere classified: Secondary | ICD-10-CM | POA: Diagnosis not present

## 2020-08-19 DIAGNOSIS — J9600 Acute respiratory failure, unspecified whether with hypoxia or hypercapnia: Secondary | ICD-10-CM | POA: Diagnosis not present

## 2020-08-19 DIAGNOSIS — Z9911 Dependence on respirator [ventilator] status: Secondary | ICD-10-CM | POA: Diagnosis not present

## 2020-08-19 DIAGNOSIS — L89150 Pressure ulcer of sacral region, unstageable: Secondary | ICD-10-CM | POA: Diagnosis not present

## 2020-08-19 DIAGNOSIS — F331 Major depressive disorder, recurrent, moderate: Secondary | ICD-10-CM | POA: Diagnosis not present

## 2020-08-19 DIAGNOSIS — G931 Anoxic brain damage, not elsewhere classified: Secondary | ICD-10-CM | POA: Diagnosis not present

## 2020-08-19 DIAGNOSIS — F321 Major depressive disorder, single episode, moderate: Secondary | ICD-10-CM | POA: Diagnosis not present

## 2020-08-20 DIAGNOSIS — G8929 Other chronic pain: Secondary | ICD-10-CM | POA: Diagnosis not present

## 2020-08-20 DIAGNOSIS — J151 Pneumonia due to Pseudomonas: Secondary | ICD-10-CM

## 2020-08-20 DIAGNOSIS — G894 Chronic pain syndrome: Secondary | ICD-10-CM | POA: Diagnosis not present

## 2020-08-20 DIAGNOSIS — G9341 Metabolic encephalopathy: Secondary | ICD-10-CM

## 2020-08-20 DIAGNOSIS — C4650 Kaposi's sarcoma of unspecified lung: Secondary | ICD-10-CM | POA: Diagnosis not present

## 2020-08-20 DIAGNOSIS — J9600 Acute respiratory failure, unspecified whether with hypoxia or hypercapnia: Secondary | ICD-10-CM | POA: Diagnosis not present

## 2020-08-20 DIAGNOSIS — J9621 Acute and chronic respiratory failure with hypoxia: Secondary | ICD-10-CM

## 2020-08-20 DIAGNOSIS — J449 Chronic obstructive pulmonary disease, unspecified: Secondary | ICD-10-CM | POA: Diagnosis not present

## 2020-08-20 DIAGNOSIS — F1120 Opioid dependence, uncomplicated: Secondary | ICD-10-CM | POA: Diagnosis not present

## 2020-08-20 DIAGNOSIS — R6521 Severe sepsis with septic shock: Secondary | ICD-10-CM

## 2020-08-20 DIAGNOSIS — G8928 Other chronic postprocedural pain: Secondary | ICD-10-CM | POA: Diagnosis not present

## 2020-08-20 DIAGNOSIS — C159 Malignant neoplasm of esophagus, unspecified: Secondary | ICD-10-CM | POA: Diagnosis not present

## 2020-08-21 DIAGNOSIS — J9611 Chronic respiratory failure with hypoxia: Secondary | ICD-10-CM

## 2020-08-21 DIAGNOSIS — G9341 Metabolic encephalopathy: Secondary | ICD-10-CM

## 2020-08-21 DIAGNOSIS — J151 Pneumonia due to Pseudomonas: Secondary | ICD-10-CM

## 2020-08-21 DIAGNOSIS — E440 Moderate protein-calorie malnutrition: Secondary | ICD-10-CM | POA: Diagnosis not present

## 2020-08-21 DIAGNOSIS — A419 Sepsis, unspecified organism: Secondary | ICD-10-CM | POA: Diagnosis not present

## 2020-08-21 DIAGNOSIS — R6521 Severe sepsis with septic shock: Secondary | ICD-10-CM

## 2020-08-21 DIAGNOSIS — N189 Chronic kidney disease, unspecified: Secondary | ICD-10-CM | POA: Diagnosis not present

## 2020-08-22 DIAGNOSIS — N189 Chronic kidney disease, unspecified: Secondary | ICD-10-CM | POA: Diagnosis not present

## 2020-08-22 DIAGNOSIS — R6521 Severe sepsis with septic shock: Secondary | ICD-10-CM

## 2020-08-22 DIAGNOSIS — R000 Tachycardia, unspecified: Secondary | ICD-10-CM | POA: Diagnosis not present

## 2020-08-22 DIAGNOSIS — E440 Moderate protein-calorie malnutrition: Secondary | ICD-10-CM | POA: Diagnosis not present

## 2020-08-22 DIAGNOSIS — J9621 Acute and chronic respiratory failure with hypoxia: Secondary | ICD-10-CM

## 2020-08-22 DIAGNOSIS — J151 Pneumonia due to Pseudomonas: Secondary | ICD-10-CM

## 2020-08-22 DIAGNOSIS — G9341 Metabolic encephalopathy: Secondary | ICD-10-CM

## 2020-08-22 DIAGNOSIS — A419 Sepsis, unspecified organism: Secondary | ICD-10-CM | POA: Diagnosis not present

## 2020-08-23 DIAGNOSIS — J9611 Chronic respiratory failure with hypoxia: Secondary | ICD-10-CM | POA: Diagnosis not present

## 2020-08-23 DIAGNOSIS — J449 Chronic obstructive pulmonary disease, unspecified: Secondary | ICD-10-CM | POA: Diagnosis not present

## 2020-08-23 DIAGNOSIS — J9621 Acute and chronic respiratory failure with hypoxia: Secondary | ICD-10-CM | POA: Diagnosis not present

## 2020-08-23 DIAGNOSIS — E43 Unspecified severe protein-calorie malnutrition: Secondary | ICD-10-CM | POA: Diagnosis not present

## 2020-08-23 DIAGNOSIS — G8929 Other chronic pain: Secondary | ICD-10-CM | POA: Diagnosis not present

## 2020-08-23 DIAGNOSIS — R000 Tachycardia, unspecified: Secondary | ICD-10-CM | POA: Diagnosis not present

## 2020-08-23 DIAGNOSIS — Z9911 Dependence on respirator [ventilator] status: Secondary | ICD-10-CM | POA: Diagnosis not present

## 2020-08-23 DIAGNOSIS — J9600 Acute respiratory failure, unspecified whether with hypoxia or hypercapnia: Secondary | ICD-10-CM | POA: Diagnosis not present

## 2020-08-23 DIAGNOSIS — R5381 Other malaise: Secondary | ICD-10-CM | POA: Diagnosis not present

## 2020-08-23 DIAGNOSIS — F1120 Opioid dependence, uncomplicated: Secondary | ICD-10-CM | POA: Diagnosis not present

## 2020-08-23 DIAGNOSIS — C4650 Kaposi's sarcoma of unspecified lung: Secondary | ICD-10-CM | POA: Diagnosis not present

## 2020-08-23 DIAGNOSIS — C159 Malignant neoplasm of esophagus, unspecified: Secondary | ICD-10-CM | POA: Diagnosis not present

## 2020-08-23 DIAGNOSIS — G894 Chronic pain syndrome: Secondary | ICD-10-CM | POA: Diagnosis not present

## 2020-08-23 DIAGNOSIS — C3490 Malignant neoplasm of unspecified part of unspecified bronchus or lung: Secondary | ICD-10-CM | POA: Diagnosis not present

## 2020-08-23 DIAGNOSIS — G8928 Other chronic postprocedural pain: Secondary | ICD-10-CM | POA: Diagnosis not present

## 2020-08-24 DIAGNOSIS — J449 Chronic obstructive pulmonary disease, unspecified: Secondary | ICD-10-CM | POA: Diagnosis not present

## 2020-08-24 DIAGNOSIS — C3490 Malignant neoplasm of unspecified part of unspecified bronchus or lung: Secondary | ICD-10-CM | POA: Diagnosis not present

## 2020-08-24 DIAGNOSIS — R262 Difficulty in walking, not elsewhere classified: Secondary | ICD-10-CM | POA: Diagnosis not present

## 2020-08-24 DIAGNOSIS — M6281 Muscle weakness (generalized): Secondary | ICD-10-CM | POA: Diagnosis not present

## 2020-08-24 DIAGNOSIS — E43 Unspecified severe protein-calorie malnutrition: Secondary | ICD-10-CM | POA: Diagnosis not present

## 2020-08-24 DIAGNOSIS — R5381 Other malaise: Secondary | ICD-10-CM | POA: Diagnosis not present

## 2020-08-24 DIAGNOSIS — J9621 Acute and chronic respiratory failure with hypoxia: Secondary | ICD-10-CM | POA: Diagnosis not present

## 2020-08-24 DIAGNOSIS — Z9911 Dependence on respirator [ventilator] status: Secondary | ICD-10-CM | POA: Diagnosis not present

## 2020-08-24 DIAGNOSIS — R000 Tachycardia, unspecified: Secondary | ICD-10-CM | POA: Diagnosis not present

## 2020-08-25 ENCOUNTER — Telehealth: Payer: Self-pay | Admitting: Legal Medicine

## 2020-08-25 DIAGNOSIS — C159 Malignant neoplasm of esophagus, unspecified: Secondary | ICD-10-CM | POA: Diagnosis not present

## 2020-08-25 DIAGNOSIS — J9600 Acute respiratory failure, unspecified whether with hypoxia or hypercapnia: Secondary | ICD-10-CM | POA: Diagnosis not present

## 2020-08-25 DIAGNOSIS — E43 Unspecified severe protein-calorie malnutrition: Secondary | ICD-10-CM | POA: Diagnosis not present

## 2020-08-25 DIAGNOSIS — Z9911 Dependence on respirator [ventilator] status: Secondary | ICD-10-CM | POA: Diagnosis not present

## 2020-08-25 DIAGNOSIS — J9621 Acute and chronic respiratory failure with hypoxia: Secondary | ICD-10-CM | POA: Diagnosis not present

## 2020-08-25 DIAGNOSIS — R5381 Other malaise: Secondary | ICD-10-CM | POA: Diagnosis not present

## 2020-08-25 DIAGNOSIS — R000 Tachycardia, unspecified: Secondary | ICD-10-CM | POA: Diagnosis not present

## 2020-08-25 DIAGNOSIS — G894 Chronic pain syndrome: Secondary | ICD-10-CM | POA: Diagnosis not present

## 2020-08-25 DIAGNOSIS — C3490 Malignant neoplasm of unspecified part of unspecified bronchus or lung: Secondary | ICD-10-CM | POA: Diagnosis not present

## 2020-08-25 DIAGNOSIS — J449 Chronic obstructive pulmonary disease, unspecified: Secondary | ICD-10-CM | POA: Diagnosis not present

## 2020-08-25 DIAGNOSIS — G8928 Other chronic postprocedural pain: Secondary | ICD-10-CM | POA: Diagnosis not present

## 2020-08-25 DIAGNOSIS — G8929 Other chronic pain: Secondary | ICD-10-CM | POA: Diagnosis not present

## 2020-08-25 DIAGNOSIS — C4650 Kaposi's sarcoma of unspecified lung: Secondary | ICD-10-CM | POA: Diagnosis not present

## 2020-08-25 DIAGNOSIS — F1120 Opioid dependence, uncomplicated: Secondary | ICD-10-CM | POA: Diagnosis not present

## 2020-08-25 NOTE — Chronic Care Management (AMB) (Signed)
Chronic Care Management   Outreach Note  08/25/2020 Name: [name] [name]. MRN: [id number] DOB: 12-04-1955  Referred by: Abigail Miyamoto, MD Reason for referral : No chief complaint on file.   A second unsuccessful telephone outreach was attempted today. The patient was referred to pharmacist for assistance with care management and care coordination.  Follow Up Plan:   Tatjana Dellinger Upstream Scheduler

## 2020-08-26 DIAGNOSIS — R5381 Other malaise: Secondary | ICD-10-CM | POA: Diagnosis not present

## 2020-08-26 DIAGNOSIS — N183 Chronic kidney disease, stage 3 unspecified: Secondary | ICD-10-CM | POA: Diagnosis not present

## 2020-08-26 DIAGNOSIS — L89159 Pressure ulcer of sacral region, unspecified stage: Secondary | ICD-10-CM | POA: Diagnosis not present

## 2020-08-26 DIAGNOSIS — R262 Difficulty in walking, not elsewhere classified: Secondary | ICD-10-CM | POA: Diagnosis not present

## 2020-08-26 DIAGNOSIS — J449 Chronic obstructive pulmonary disease, unspecified: Secondary | ICD-10-CM | POA: Diagnosis not present

## 2020-08-26 DIAGNOSIS — J9621 Acute and chronic respiratory failure with hypoxia: Secondary | ICD-10-CM | POA: Diagnosis not present

## 2020-08-26 DIAGNOSIS — R000 Tachycardia, unspecified: Secondary | ICD-10-CM | POA: Diagnosis not present

## 2020-08-26 DIAGNOSIS — M6281 Muscle weakness (generalized): Secondary | ICD-10-CM | POA: Diagnosis not present

## 2020-08-26 DIAGNOSIS — E43 Unspecified severe protein-calorie malnutrition: Secondary | ICD-10-CM | POA: Diagnosis not present

## 2020-08-26 DIAGNOSIS — C3490 Malignant neoplasm of unspecified part of unspecified bronchus or lung: Secondary | ICD-10-CM | POA: Diagnosis not present

## 2020-08-26 DIAGNOSIS — Z9911 Dependence on respirator [ventilator] status: Secondary | ICD-10-CM | POA: Diagnosis not present

## 2020-08-27 DIAGNOSIS — C3490 Malignant neoplasm of unspecified part of unspecified bronchus or lung: Secondary | ICD-10-CM | POA: Diagnosis not present

## 2020-08-27 DIAGNOSIS — R000 Tachycardia, unspecified: Secondary | ICD-10-CM | POA: Diagnosis not present

## 2020-08-27 DIAGNOSIS — R5381 Other malaise: Secondary | ICD-10-CM | POA: Diagnosis not present

## 2020-08-27 DIAGNOSIS — R262 Difficulty in walking, not elsewhere classified: Secondary | ICD-10-CM | POA: Diagnosis not present

## 2020-08-27 DIAGNOSIS — J9621 Acute and chronic respiratory failure with hypoxia: Secondary | ICD-10-CM | POA: Diagnosis not present

## 2020-08-27 DIAGNOSIS — M6281 Muscle weakness (generalized): Secondary | ICD-10-CM | POA: Diagnosis not present

## 2020-08-27 DIAGNOSIS — J449 Chronic obstructive pulmonary disease, unspecified: Secondary | ICD-10-CM | POA: Diagnosis not present

## 2020-08-27 DIAGNOSIS — E43 Unspecified severe protein-calorie malnutrition: Secondary | ICD-10-CM | POA: Diagnosis not present

## 2020-08-27 DIAGNOSIS — Z9989 Dependence on other enabling machines and devices: Secondary | ICD-10-CM | POA: Diagnosis not present

## 2020-08-28 DIAGNOSIS — C3490 Malignant neoplasm of unspecified part of unspecified bronchus or lung: Secondary | ICD-10-CM | POA: Diagnosis not present

## 2020-08-28 DIAGNOSIS — Z9989 Dependence on other enabling machines and devices: Secondary | ICD-10-CM | POA: Diagnosis not present

## 2020-08-28 DIAGNOSIS — J449 Chronic obstructive pulmonary disease, unspecified: Secondary | ICD-10-CM | POA: Diagnosis not present

## 2020-08-28 DIAGNOSIS — G9341 Metabolic encephalopathy: Secondary | ICD-10-CM | POA: Diagnosis not present

## 2020-08-28 DIAGNOSIS — J9621 Acute and chronic respiratory failure with hypoxia: Secondary | ICD-10-CM | POA: Diagnosis not present

## 2020-08-28 DIAGNOSIS — N179 Acute kidney failure, unspecified: Secondary | ICD-10-CM | POA: Diagnosis not present

## 2020-08-28 DIAGNOSIS — J9601 Acute respiratory failure with hypoxia: Secondary | ICD-10-CM | POA: Diagnosis not present

## 2020-08-29 DIAGNOSIS — N179 Acute kidney failure, unspecified: Secondary | ICD-10-CM | POA: Diagnosis not present

## 2020-08-29 DIAGNOSIS — J9601 Acute respiratory failure with hypoxia: Secondary | ICD-10-CM | POA: Diagnosis not present

## 2020-08-29 DIAGNOSIS — G9341 Metabolic encephalopathy: Secondary | ICD-10-CM | POA: Diagnosis not present

## 2020-08-30 DIAGNOSIS — J9621 Acute and chronic respiratory failure with hypoxia: Secondary | ICD-10-CM

## 2020-08-30 DIAGNOSIS — J9601 Acute respiratory failure with hypoxia: Secondary | ICD-10-CM | POA: Diagnosis not present

## 2020-08-30 DIAGNOSIS — R6521 Severe sepsis with septic shock: Secondary | ICD-10-CM

## 2020-08-30 DIAGNOSIS — G9341 Metabolic encephalopathy: Secondary | ICD-10-CM | POA: Diagnosis not present

## 2020-08-30 DIAGNOSIS — N179 Acute kidney failure, unspecified: Secondary | ICD-10-CM | POA: Diagnosis not present

## 2020-08-30 DIAGNOSIS — J151 Pneumonia due to Pseudomonas: Secondary | ICD-10-CM

## 2020-08-31 DIAGNOSIS — G9341 Metabolic encephalopathy: Secondary | ICD-10-CM

## 2020-08-31 DIAGNOSIS — R5381 Other malaise: Secondary | ICD-10-CM | POA: Diagnosis not present

## 2020-08-31 DIAGNOSIS — J151 Pneumonia due to Pseudomonas: Secondary | ICD-10-CM

## 2020-08-31 DIAGNOSIS — R000 Tachycardia, unspecified: Secondary | ICD-10-CM | POA: Diagnosis not present

## 2020-08-31 DIAGNOSIS — E43 Unspecified severe protein-calorie malnutrition: Secondary | ICD-10-CM | POA: Diagnosis not present

## 2020-08-31 DIAGNOSIS — J9621 Acute and chronic respiratory failure with hypoxia: Secondary | ICD-10-CM

## 2020-08-31 DIAGNOSIS — R6521 Severe sepsis with septic shock: Secondary | ICD-10-CM

## 2020-09-01 DIAGNOSIS — E43 Unspecified severe protein-calorie malnutrition: Secondary | ICD-10-CM | POA: Diagnosis not present

## 2020-09-01 DIAGNOSIS — R000 Tachycardia, unspecified: Secondary | ICD-10-CM | POA: Diagnosis not present

## 2020-09-01 DIAGNOSIS — R262 Difficulty in walking, not elsewhere classified: Secondary | ICD-10-CM | POA: Diagnosis not present

## 2020-09-01 DIAGNOSIS — R6521 Severe sepsis with septic shock: Secondary | ICD-10-CM

## 2020-09-01 DIAGNOSIS — J151 Pneumonia due to Pseudomonas: Secondary | ICD-10-CM

## 2020-09-01 DIAGNOSIS — J9621 Acute and chronic respiratory failure with hypoxia: Secondary | ICD-10-CM

## 2020-09-01 DIAGNOSIS — M6281 Muscle weakness (generalized): Secondary | ICD-10-CM | POA: Diagnosis not present

## 2020-09-01 DIAGNOSIS — R5381 Other malaise: Secondary | ICD-10-CM | POA: Diagnosis not present

## 2020-09-01 DIAGNOSIS — G9341 Metabolic encephalopathy: Secondary | ICD-10-CM

## 2020-09-02 DIAGNOSIS — J9621 Acute and chronic respiratory failure with hypoxia: Secondary | ICD-10-CM

## 2020-09-02 DIAGNOSIS — R6521 Severe sepsis with septic shock: Secondary | ICD-10-CM

## 2020-09-02 DIAGNOSIS — G9341 Metabolic encephalopathy: Secondary | ICD-10-CM

## 2020-09-02 DIAGNOSIS — J151 Pneumonia due to Pseudomonas: Secondary | ICD-10-CM

## 2020-09-02 DIAGNOSIS — R000 Tachycardia, unspecified: Secondary | ICD-10-CM | POA: Diagnosis not present

## 2020-09-02 DIAGNOSIS — E43 Unspecified severe protein-calorie malnutrition: Secondary | ICD-10-CM | POA: Diagnosis not present

## 2020-09-02 DIAGNOSIS — R5381 Other malaise: Secondary | ICD-10-CM | POA: Diagnosis not present

## 2020-09-03 DIAGNOSIS — J449 Chronic obstructive pulmonary disease, unspecified: Secondary | ICD-10-CM | POA: Diagnosis not present

## 2020-09-03 DIAGNOSIS — C159 Malignant neoplasm of esophagus, unspecified: Secondary | ICD-10-CM | POA: Diagnosis not present

## 2020-09-03 DIAGNOSIS — G8928 Other chronic postprocedural pain: Secondary | ICD-10-CM | POA: Diagnosis not present

## 2020-09-03 DIAGNOSIS — M6281 Muscle weakness (generalized): Secondary | ICD-10-CM | POA: Diagnosis not present

## 2020-09-03 DIAGNOSIS — J9600 Acute respiratory failure, unspecified whether with hypoxia or hypercapnia: Secondary | ICD-10-CM | POA: Diagnosis not present

## 2020-09-03 DIAGNOSIS — R5381 Other malaise: Secondary | ICD-10-CM | POA: Diagnosis not present

## 2020-09-03 DIAGNOSIS — R262 Difficulty in walking, not elsewhere classified: Secondary | ICD-10-CM | POA: Diagnosis not present

## 2020-09-03 DIAGNOSIS — G9341 Metabolic encephalopathy: Secondary | ICD-10-CM

## 2020-09-03 DIAGNOSIS — F1120 Opioid dependence, uncomplicated: Secondary | ICD-10-CM | POA: Diagnosis not present

## 2020-09-03 DIAGNOSIS — R6521 Severe sepsis with septic shock: Secondary | ICD-10-CM

## 2020-09-03 DIAGNOSIS — J151 Pneumonia due to Pseudomonas: Secondary | ICD-10-CM

## 2020-09-03 DIAGNOSIS — G894 Chronic pain syndrome: Secondary | ICD-10-CM | POA: Diagnosis not present

## 2020-09-03 DIAGNOSIS — C4650 Kaposi's sarcoma of unspecified lung: Secondary | ICD-10-CM | POA: Diagnosis not present

## 2020-09-03 DIAGNOSIS — G8929 Other chronic pain: Secondary | ICD-10-CM | POA: Diagnosis not present

## 2020-09-03 DIAGNOSIS — E43 Unspecified severe protein-calorie malnutrition: Secondary | ICD-10-CM | POA: Diagnosis not present

## 2020-09-03 DIAGNOSIS — R000 Tachycardia, unspecified: Secondary | ICD-10-CM | POA: Diagnosis not present

## 2020-09-03 DIAGNOSIS — J9621 Acute and chronic respiratory failure with hypoxia: Secondary | ICD-10-CM

## 2020-09-04 DIAGNOSIS — R000 Tachycardia, unspecified: Secondary | ICD-10-CM | POA: Diagnosis not present

## 2020-09-04 DIAGNOSIS — R5381 Other malaise: Secondary | ICD-10-CM | POA: Diagnosis not present

## 2020-09-04 DIAGNOSIS — E43 Unspecified severe protein-calorie malnutrition: Secondary | ICD-10-CM | POA: Diagnosis not present

## 2020-09-05 DIAGNOSIS — J9621 Acute and chronic respiratory failure with hypoxia: Secondary | ICD-10-CM

## 2020-09-05 DIAGNOSIS — R5381 Other malaise: Secondary | ICD-10-CM | POA: Diagnosis not present

## 2020-09-05 DIAGNOSIS — E43 Unspecified severe protein-calorie malnutrition: Secondary | ICD-10-CM | POA: Diagnosis not present

## 2020-09-05 DIAGNOSIS — R6521 Severe sepsis with septic shock: Secondary | ICD-10-CM

## 2020-09-05 DIAGNOSIS — G9341 Metabolic encephalopathy: Secondary | ICD-10-CM

## 2020-09-05 DIAGNOSIS — R000 Tachycardia, unspecified: Secondary | ICD-10-CM | POA: Diagnosis not present

## 2020-09-05 DIAGNOSIS — J151 Pneumonia due to Pseudomonas: Secondary | ICD-10-CM

## 2020-09-06 DIAGNOSIS — C4650 Kaposi's sarcoma of unspecified lung: Secondary | ICD-10-CM | POA: Diagnosis not present

## 2020-09-06 DIAGNOSIS — R000 Tachycardia, unspecified: Secondary | ICD-10-CM | POA: Diagnosis not present

## 2020-09-06 DIAGNOSIS — G8929 Other chronic pain: Secondary | ICD-10-CM | POA: Diagnosis not present

## 2020-09-06 DIAGNOSIS — J9600 Acute respiratory failure, unspecified whether with hypoxia or hypercapnia: Secondary | ICD-10-CM | POA: Diagnosis not present

## 2020-09-06 DIAGNOSIS — Z430 Encounter for attention to tracheostomy: Secondary | ICD-10-CM | POA: Diagnosis not present

## 2020-09-06 DIAGNOSIS — G894 Chronic pain syndrome: Secondary | ICD-10-CM | POA: Diagnosis not present

## 2020-09-06 DIAGNOSIS — F1120 Opioid dependence, uncomplicated: Secondary | ICD-10-CM | POA: Diagnosis not present

## 2020-09-06 DIAGNOSIS — R5381 Other malaise: Secondary | ICD-10-CM | POA: Diagnosis not present

## 2020-09-06 DIAGNOSIS — J449 Chronic obstructive pulmonary disease, unspecified: Secondary | ICD-10-CM | POA: Diagnosis not present

## 2020-09-06 DIAGNOSIS — J387 Other diseases of larynx: Secondary | ICD-10-CM | POA: Diagnosis not present

## 2020-09-06 DIAGNOSIS — C159 Malignant neoplasm of esophagus, unspecified: Secondary | ICD-10-CM | POA: Diagnosis not present

## 2020-09-06 DIAGNOSIS — J398 Other specified diseases of upper respiratory tract: Secondary | ICD-10-CM | POA: Diagnosis not present

## 2020-09-06 DIAGNOSIS — J690 Pneumonitis due to inhalation of food and vomit: Secondary | ICD-10-CM | POA: Diagnosis not present

## 2020-09-06 DIAGNOSIS — G8928 Other chronic postprocedural pain: Secondary | ICD-10-CM | POA: Diagnosis not present

## 2020-09-06 DIAGNOSIS — E43 Unspecified severe protein-calorie malnutrition: Secondary | ICD-10-CM | POA: Diagnosis not present

## 2020-09-07 DIAGNOSIS — R5381 Other malaise: Secondary | ICD-10-CM | POA: Diagnosis not present

## 2020-09-07 DIAGNOSIS — J151 Pneumonia due to Pseudomonas: Secondary | ICD-10-CM

## 2020-09-07 DIAGNOSIS — G9341 Metabolic encephalopathy: Secondary | ICD-10-CM

## 2020-09-07 DIAGNOSIS — R000 Tachycardia, unspecified: Secondary | ICD-10-CM | POA: Diagnosis not present

## 2020-09-07 DIAGNOSIS — R262 Difficulty in walking, not elsewhere classified: Secondary | ICD-10-CM | POA: Diagnosis not present

## 2020-09-07 DIAGNOSIS — F419 Anxiety disorder, unspecified: Secondary | ICD-10-CM | POA: Diagnosis not present

## 2020-09-07 DIAGNOSIS — M6281 Muscle weakness (generalized): Secondary | ICD-10-CM | POA: Diagnosis not present

## 2020-09-07 DIAGNOSIS — J9621 Acute and chronic respiratory failure with hypoxia: Secondary | ICD-10-CM

## 2020-09-07 DIAGNOSIS — F321 Major depressive disorder, single episode, moderate: Secondary | ICD-10-CM | POA: Diagnosis not present

## 2020-09-07 DIAGNOSIS — R6521 Severe sepsis with septic shock: Secondary | ICD-10-CM

## 2020-09-07 DIAGNOSIS — E43 Unspecified severe protein-calorie malnutrition: Secondary | ICD-10-CM | POA: Diagnosis not present

## 2020-09-08 DIAGNOSIS — J151 Pneumonia due to Pseudomonas: Secondary | ICD-10-CM

## 2020-09-08 DIAGNOSIS — G8928 Other chronic postprocedural pain: Secondary | ICD-10-CM | POA: Diagnosis not present

## 2020-09-08 DIAGNOSIS — C159 Malignant neoplasm of esophagus, unspecified: Secondary | ICD-10-CM | POA: Diagnosis not present

## 2020-09-08 DIAGNOSIS — G894 Chronic pain syndrome: Secondary | ICD-10-CM | POA: Diagnosis not present

## 2020-09-08 DIAGNOSIS — J9600 Acute respiratory failure, unspecified whether with hypoxia or hypercapnia: Secondary | ICD-10-CM | POA: Diagnosis not present

## 2020-09-08 DIAGNOSIS — R000 Tachycardia, unspecified: Secondary | ICD-10-CM | POA: Diagnosis not present

## 2020-09-08 DIAGNOSIS — R5381 Other malaise: Secondary | ICD-10-CM | POA: Diagnosis not present

## 2020-09-08 DIAGNOSIS — C4650 Kaposi's sarcoma of unspecified lung: Secondary | ICD-10-CM | POA: Diagnosis not present

## 2020-09-08 DIAGNOSIS — G8929 Other chronic pain: Secondary | ICD-10-CM | POA: Diagnosis not present

## 2020-09-08 DIAGNOSIS — J449 Chronic obstructive pulmonary disease, unspecified: Secondary | ICD-10-CM | POA: Diagnosis not present

## 2020-09-08 DIAGNOSIS — G9341 Metabolic encephalopathy: Secondary | ICD-10-CM

## 2020-09-08 DIAGNOSIS — E43 Unspecified severe protein-calorie malnutrition: Secondary | ICD-10-CM | POA: Diagnosis not present

## 2020-09-08 DIAGNOSIS — F1120 Opioid dependence, uncomplicated: Secondary | ICD-10-CM | POA: Diagnosis not present

## 2020-09-08 DIAGNOSIS — R6521 Severe sepsis with septic shock: Secondary | ICD-10-CM

## 2020-09-08 DIAGNOSIS — J9621 Acute and chronic respiratory failure with hypoxia: Secondary | ICD-10-CM

## 2020-09-09 DIAGNOSIS — E43 Unspecified severe protein-calorie malnutrition: Secondary | ICD-10-CM | POA: Diagnosis not present

## 2020-09-09 DIAGNOSIS — G9341 Metabolic encephalopathy: Secondary | ICD-10-CM

## 2020-09-09 DIAGNOSIS — J9621 Acute and chronic respiratory failure with hypoxia: Secondary | ICD-10-CM

## 2020-09-09 DIAGNOSIS — J151 Pneumonia due to Pseudomonas: Secondary | ICD-10-CM

## 2020-09-09 DIAGNOSIS — R6521 Severe sepsis with septic shock: Secondary | ICD-10-CM

## 2020-09-09 DIAGNOSIS — R000 Tachycardia, unspecified: Secondary | ICD-10-CM | POA: Diagnosis not present

## 2020-09-09 DIAGNOSIS — R5381 Other malaise: Secondary | ICD-10-CM | POA: Diagnosis not present

## 2020-09-10 DIAGNOSIS — J449 Chronic obstructive pulmonary disease, unspecified: Secondary | ICD-10-CM | POA: Diagnosis not present

## 2020-09-10 DIAGNOSIS — G8929 Other chronic pain: Secondary | ICD-10-CM | POA: Diagnosis not present

## 2020-09-10 DIAGNOSIS — G894 Chronic pain syndrome: Secondary | ICD-10-CM | POA: Diagnosis not present

## 2020-09-10 DIAGNOSIS — R000 Tachycardia, unspecified: Secondary | ICD-10-CM | POA: Diagnosis not present

## 2020-09-10 DIAGNOSIS — R5381 Other malaise: Secondary | ICD-10-CM | POA: Diagnosis not present

## 2020-09-10 DIAGNOSIS — R6521 Severe sepsis with septic shock: Secondary | ICD-10-CM

## 2020-09-10 DIAGNOSIS — J9621 Acute and chronic respiratory failure with hypoxia: Secondary | ICD-10-CM

## 2020-09-10 DIAGNOSIS — J151 Pneumonia due to Pseudomonas: Secondary | ICD-10-CM

## 2020-09-10 DIAGNOSIS — F1120 Opioid dependence, uncomplicated: Secondary | ICD-10-CM | POA: Diagnosis not present

## 2020-09-10 DIAGNOSIS — J9600 Acute respiratory failure, unspecified whether with hypoxia or hypercapnia: Secondary | ICD-10-CM | POA: Diagnosis not present

## 2020-09-10 DIAGNOSIS — C4650 Kaposi's sarcoma of unspecified lung: Secondary | ICD-10-CM | POA: Diagnosis not present

## 2020-09-10 DIAGNOSIS — G8928 Other chronic postprocedural pain: Secondary | ICD-10-CM | POA: Diagnosis not present

## 2020-09-10 DIAGNOSIS — C159 Malignant neoplasm of esophagus, unspecified: Secondary | ICD-10-CM | POA: Diagnosis not present

## 2020-09-10 DIAGNOSIS — E43 Unspecified severe protein-calorie malnutrition: Secondary | ICD-10-CM | POA: Diagnosis not present

## 2020-09-10 DIAGNOSIS — G9341 Metabolic encephalopathy: Secondary | ICD-10-CM

## 2020-09-11 DIAGNOSIS — N189 Chronic kidney disease, unspecified: Secondary | ICD-10-CM | POA: Diagnosis not present

## 2020-09-11 DIAGNOSIS — A419 Sepsis, unspecified organism: Secondary | ICD-10-CM | POA: Diagnosis not present

## 2020-09-11 DIAGNOSIS — E440 Moderate protein-calorie malnutrition: Secondary | ICD-10-CM | POA: Diagnosis not present

## 2020-09-11 DIAGNOSIS — J9621 Acute and chronic respiratory failure with hypoxia: Secondary | ICD-10-CM

## 2020-09-11 DIAGNOSIS — G9341 Metabolic encephalopathy: Secondary | ICD-10-CM

## 2020-09-11 DIAGNOSIS — R6521 Severe sepsis with septic shock: Secondary | ICD-10-CM

## 2020-09-11 DIAGNOSIS — J151 Pneumonia due to Pseudomonas: Secondary | ICD-10-CM

## 2020-09-12 DIAGNOSIS — J9621 Acute and chronic respiratory failure with hypoxia: Secondary | ICD-10-CM

## 2020-09-12 DIAGNOSIS — N189 Chronic kidney disease, unspecified: Secondary | ICD-10-CM | POA: Diagnosis not present

## 2020-09-12 DIAGNOSIS — R6521 Severe sepsis with septic shock: Secondary | ICD-10-CM

## 2020-09-12 DIAGNOSIS — G9341 Metabolic encephalopathy: Secondary | ICD-10-CM

## 2020-09-12 DIAGNOSIS — J151 Pneumonia due to Pseudomonas: Secondary | ICD-10-CM

## 2020-09-12 DIAGNOSIS — E440 Moderate protein-calorie malnutrition: Secondary | ICD-10-CM | POA: Diagnosis not present

## 2020-09-12 DIAGNOSIS — A419 Sepsis, unspecified organism: Secondary | ICD-10-CM | POA: Diagnosis not present

## 2020-09-13 DIAGNOSIS — J9621 Acute and chronic respiratory failure with hypoxia: Secondary | ICD-10-CM

## 2020-09-13 DIAGNOSIS — F1120 Opioid dependence, uncomplicated: Secondary | ICD-10-CM | POA: Diagnosis not present

## 2020-09-13 DIAGNOSIS — R5381 Other malaise: Secondary | ICD-10-CM | POA: Diagnosis not present

## 2020-09-13 DIAGNOSIS — G8928 Other chronic postprocedural pain: Secondary | ICD-10-CM | POA: Diagnosis not present

## 2020-09-13 DIAGNOSIS — G8929 Other chronic pain: Secondary | ICD-10-CM | POA: Diagnosis not present

## 2020-09-13 DIAGNOSIS — R6521 Severe sepsis with septic shock: Secondary | ICD-10-CM

## 2020-09-13 DIAGNOSIS — R000 Tachycardia, unspecified: Secondary | ICD-10-CM | POA: Diagnosis not present

## 2020-09-13 DIAGNOSIS — J9600 Acute respiratory failure, unspecified whether with hypoxia or hypercapnia: Secondary | ICD-10-CM | POA: Diagnosis not present

## 2020-09-13 DIAGNOSIS — G9341 Metabolic encephalopathy: Secondary | ICD-10-CM

## 2020-09-13 DIAGNOSIS — C4650 Kaposi's sarcoma of unspecified lung: Secondary | ICD-10-CM | POA: Diagnosis not present

## 2020-09-13 DIAGNOSIS — G894 Chronic pain syndrome: Secondary | ICD-10-CM | POA: Diagnosis not present

## 2020-09-13 DIAGNOSIS — J449 Chronic obstructive pulmonary disease, unspecified: Secondary | ICD-10-CM | POA: Diagnosis not present

## 2020-09-13 DIAGNOSIS — M6281 Muscle weakness (generalized): Secondary | ICD-10-CM | POA: Diagnosis not present

## 2020-09-13 DIAGNOSIS — E43 Unspecified severe protein-calorie malnutrition: Secondary | ICD-10-CM | POA: Diagnosis not present

## 2020-09-13 DIAGNOSIS — R262 Difficulty in walking, not elsewhere classified: Secondary | ICD-10-CM | POA: Diagnosis not present

## 2020-09-13 DIAGNOSIS — J151 Pneumonia due to Pseudomonas: Secondary | ICD-10-CM

## 2020-09-13 DIAGNOSIS — C159 Malignant neoplasm of esophagus, unspecified: Secondary | ICD-10-CM | POA: Diagnosis not present

## 2020-09-14 DIAGNOSIS — J151 Pneumonia due to Pseudomonas: Secondary | ICD-10-CM

## 2020-09-14 DIAGNOSIS — R000 Tachycardia, unspecified: Secondary | ICD-10-CM | POA: Diagnosis not present

## 2020-09-14 DIAGNOSIS — J9621 Acute and chronic respiratory failure with hypoxia: Secondary | ICD-10-CM

## 2020-09-14 DIAGNOSIS — R5381 Other malaise: Secondary | ICD-10-CM | POA: Diagnosis not present

## 2020-09-14 DIAGNOSIS — R6521 Severe sepsis with septic shock: Secondary | ICD-10-CM

## 2020-09-14 DIAGNOSIS — G9341 Metabolic encephalopathy: Secondary | ICD-10-CM

## 2020-09-14 DIAGNOSIS — E43 Unspecified severe protein-calorie malnutrition: Secondary | ICD-10-CM | POA: Diagnosis not present

## 2020-09-15 DIAGNOSIS — F1120 Opioid dependence, uncomplicated: Secondary | ICD-10-CM | POA: Diagnosis not present

## 2020-09-15 DIAGNOSIS — G8929 Other chronic pain: Secondary | ICD-10-CM | POA: Diagnosis not present

## 2020-09-15 DIAGNOSIS — R5381 Other malaise: Secondary | ICD-10-CM | POA: Diagnosis not present

## 2020-09-15 DIAGNOSIS — M6281 Muscle weakness (generalized): Secondary | ICD-10-CM | POA: Diagnosis not present

## 2020-09-15 DIAGNOSIS — R000 Tachycardia, unspecified: Secondary | ICD-10-CM | POA: Diagnosis not present

## 2020-09-15 DIAGNOSIS — C4650 Kaposi's sarcoma of unspecified lung: Secondary | ICD-10-CM | POA: Diagnosis not present

## 2020-09-15 DIAGNOSIS — J9600 Acute respiratory failure, unspecified whether with hypoxia or hypercapnia: Secondary | ICD-10-CM | POA: Diagnosis not present

## 2020-09-15 DIAGNOSIS — C159 Malignant neoplasm of esophagus, unspecified: Secondary | ICD-10-CM | POA: Diagnosis not present

## 2020-09-15 DIAGNOSIS — E43 Unspecified severe protein-calorie malnutrition: Secondary | ICD-10-CM | POA: Diagnosis not present

## 2020-09-15 DIAGNOSIS — J9621 Acute and chronic respiratory failure with hypoxia: Secondary | ICD-10-CM

## 2020-09-15 DIAGNOSIS — G894 Chronic pain syndrome: Secondary | ICD-10-CM | POA: Diagnosis not present

## 2020-09-15 DIAGNOSIS — J449 Chronic obstructive pulmonary disease, unspecified: Secondary | ICD-10-CM | POA: Diagnosis not present

## 2020-09-15 DIAGNOSIS — R262 Difficulty in walking, not elsewhere classified: Secondary | ICD-10-CM | POA: Diagnosis not present

## 2020-09-15 DIAGNOSIS — G8928 Other chronic postprocedural pain: Secondary | ICD-10-CM | POA: Diagnosis not present

## 2020-09-15 DIAGNOSIS — G9341 Metabolic encephalopathy: Secondary | ICD-10-CM

## 2020-09-15 DIAGNOSIS — J151 Pneumonia due to Pseudomonas: Secondary | ICD-10-CM

## 2020-09-15 DIAGNOSIS — R6521 Severe sepsis with septic shock: Secondary | ICD-10-CM

## 2020-09-16 DIAGNOSIS — R5381 Other malaise: Secondary | ICD-10-CM | POA: Diagnosis not present

## 2020-09-16 DIAGNOSIS — R000 Tachycardia, unspecified: Secondary | ICD-10-CM | POA: Diagnosis not present

## 2020-09-16 DIAGNOSIS — G9341 Metabolic encephalopathy: Secondary | ICD-10-CM

## 2020-09-16 DIAGNOSIS — J9621 Acute and chronic respiratory failure with hypoxia: Secondary | ICD-10-CM

## 2020-09-16 DIAGNOSIS — E43 Unspecified severe protein-calorie malnutrition: Secondary | ICD-10-CM | POA: Diagnosis not present

## 2020-09-16 DIAGNOSIS — R6521 Severe sepsis with septic shock: Secondary | ICD-10-CM

## 2020-09-16 DIAGNOSIS — J151 Pneumonia due to Pseudomonas: Secondary | ICD-10-CM

## 2020-09-17 DIAGNOSIS — G894 Chronic pain syndrome: Secondary | ICD-10-CM | POA: Diagnosis not present

## 2020-09-17 DIAGNOSIS — J449 Chronic obstructive pulmonary disease, unspecified: Secondary | ICD-10-CM | POA: Diagnosis not present

## 2020-09-17 DIAGNOSIS — R000 Tachycardia, unspecified: Secondary | ICD-10-CM | POA: Diagnosis not present

## 2020-09-17 DIAGNOSIS — E43 Unspecified severe protein-calorie malnutrition: Secondary | ICD-10-CM | POA: Diagnosis not present

## 2020-09-17 DIAGNOSIS — J151 Pneumonia due to Pseudomonas: Secondary | ICD-10-CM

## 2020-09-17 DIAGNOSIS — G8929 Other chronic pain: Secondary | ICD-10-CM | POA: Diagnosis not present

## 2020-09-17 DIAGNOSIS — J9600 Acute respiratory failure, unspecified whether with hypoxia or hypercapnia: Secondary | ICD-10-CM | POA: Diagnosis not present

## 2020-09-17 DIAGNOSIS — C4650 Kaposi's sarcoma of unspecified lung: Secondary | ICD-10-CM | POA: Diagnosis not present

## 2020-09-17 DIAGNOSIS — G9341 Metabolic encephalopathy: Secondary | ICD-10-CM

## 2020-09-17 DIAGNOSIS — R6521 Severe sepsis with septic shock: Secondary | ICD-10-CM

## 2020-09-17 DIAGNOSIS — G8928 Other chronic postprocedural pain: Secondary | ICD-10-CM | POA: Diagnosis not present

## 2020-09-17 DIAGNOSIS — C159 Malignant neoplasm of esophagus, unspecified: Secondary | ICD-10-CM | POA: Diagnosis not present

## 2020-09-17 DIAGNOSIS — R5381 Other malaise: Secondary | ICD-10-CM | POA: Diagnosis not present

## 2020-09-17 DIAGNOSIS — F1120 Opioid dependence, uncomplicated: Secondary | ICD-10-CM | POA: Diagnosis not present

## 2020-09-17 DIAGNOSIS — J9621 Acute and chronic respiratory failure with hypoxia: Secondary | ICD-10-CM

## 2020-09-18 DIAGNOSIS — R6521 Severe sepsis with septic shock: Secondary | ICD-10-CM

## 2020-09-18 DIAGNOSIS — J9621 Acute and chronic respiratory failure with hypoxia: Secondary | ICD-10-CM

## 2020-09-18 DIAGNOSIS — J151 Pneumonia due to Pseudomonas: Secondary | ICD-10-CM

## 2020-09-18 DIAGNOSIS — E43 Unspecified severe protein-calorie malnutrition: Secondary | ICD-10-CM | POA: Diagnosis not present

## 2020-09-18 DIAGNOSIS — R000 Tachycardia, unspecified: Secondary | ICD-10-CM | POA: Diagnosis not present

## 2020-09-18 DIAGNOSIS — R5381 Other malaise: Secondary | ICD-10-CM | POA: Diagnosis not present

## 2020-09-18 DIAGNOSIS — G9341 Metabolic encephalopathy: Secondary | ICD-10-CM

## 2020-09-19 DIAGNOSIS — R5381 Other malaise: Secondary | ICD-10-CM | POA: Diagnosis not present

## 2020-09-19 DIAGNOSIS — J151 Pneumonia due to Pseudomonas: Secondary | ICD-10-CM

## 2020-09-19 DIAGNOSIS — R000 Tachycardia, unspecified: Secondary | ICD-10-CM | POA: Diagnosis not present

## 2020-09-19 DIAGNOSIS — G9341 Metabolic encephalopathy: Secondary | ICD-10-CM

## 2020-09-19 DIAGNOSIS — R6521 Severe sepsis with septic shock: Secondary | ICD-10-CM

## 2020-09-19 DIAGNOSIS — J9621 Acute and chronic respiratory failure with hypoxia: Secondary | ICD-10-CM

## 2020-09-19 DIAGNOSIS — E43 Unspecified severe protein-calorie malnutrition: Secondary | ICD-10-CM | POA: Diagnosis not present

## 2020-09-20 DIAGNOSIS — J9621 Acute and chronic respiratory failure with hypoxia: Secondary | ICD-10-CM | POA: Diagnosis not present

## 2020-09-20 DIAGNOSIS — E43 Unspecified severe protein-calorie malnutrition: Secondary | ICD-10-CM | POA: Diagnosis not present

## 2020-09-20 DIAGNOSIS — C4650 Kaposi's sarcoma of unspecified lung: Secondary | ICD-10-CM | POA: Diagnosis not present

## 2020-09-20 DIAGNOSIS — C159 Malignant neoplasm of esophagus, unspecified: Secondary | ICD-10-CM | POA: Diagnosis not present

## 2020-09-20 DIAGNOSIS — F1120 Opioid dependence, uncomplicated: Secondary | ICD-10-CM | POA: Diagnosis not present

## 2020-09-20 DIAGNOSIS — G8928 Other chronic postprocedural pain: Secondary | ICD-10-CM | POA: Diagnosis not present

## 2020-09-20 DIAGNOSIS — C3490 Malignant neoplasm of unspecified part of unspecified bronchus or lung: Secondary | ICD-10-CM | POA: Diagnosis not present

## 2020-09-20 DIAGNOSIS — R5381 Other malaise: Secondary | ICD-10-CM | POA: Diagnosis not present

## 2020-09-20 DIAGNOSIS — J449 Chronic obstructive pulmonary disease, unspecified: Secondary | ICD-10-CM | POA: Diagnosis not present

## 2020-09-20 DIAGNOSIS — R000 Tachycardia, unspecified: Secondary | ICD-10-CM | POA: Diagnosis not present

## 2020-09-20 DIAGNOSIS — Z9989 Dependence on other enabling machines and devices: Secondary | ICD-10-CM | POA: Diagnosis not present

## 2020-09-20 DIAGNOSIS — G8929 Other chronic pain: Secondary | ICD-10-CM | POA: Diagnosis not present

## 2020-09-20 DIAGNOSIS — J9600 Acute respiratory failure, unspecified whether with hypoxia or hypercapnia: Secondary | ICD-10-CM | POA: Diagnosis not present

## 2020-09-20 DIAGNOSIS — G894 Chronic pain syndrome: Secondary | ICD-10-CM | POA: Diagnosis not present

## 2020-09-21 DIAGNOSIS — C3490 Malignant neoplasm of unspecified part of unspecified bronchus or lung: Secondary | ICD-10-CM | POA: Diagnosis not present

## 2020-09-21 DIAGNOSIS — R5381 Other malaise: Secondary | ICD-10-CM | POA: Diagnosis not present

## 2020-09-21 DIAGNOSIS — R000 Tachycardia, unspecified: Secondary | ICD-10-CM | POA: Diagnosis not present

## 2020-09-21 DIAGNOSIS — Z9989 Dependence on other enabling machines and devices: Secondary | ICD-10-CM | POA: Diagnosis not present

## 2020-09-21 DIAGNOSIS — J449 Chronic obstructive pulmonary disease, unspecified: Secondary | ICD-10-CM | POA: Diagnosis not present

## 2020-09-21 DIAGNOSIS — J9621 Acute and chronic respiratory failure with hypoxia: Secondary | ICD-10-CM | POA: Diagnosis not present

## 2020-09-21 DIAGNOSIS — E43 Unspecified severe protein-calorie malnutrition: Secondary | ICD-10-CM | POA: Diagnosis not present

## 2020-09-22 DIAGNOSIS — C159 Malignant neoplasm of esophagus, unspecified: Secondary | ICD-10-CM | POA: Diagnosis not present

## 2020-09-22 DIAGNOSIS — J9611 Chronic respiratory failure with hypoxia: Secondary | ICD-10-CM | POA: Diagnosis not present

## 2020-09-22 DIAGNOSIS — G8929 Other chronic pain: Secondary | ICD-10-CM | POA: Diagnosis not present

## 2020-09-22 DIAGNOSIS — J9621 Acute and chronic respiratory failure with hypoxia: Secondary | ICD-10-CM | POA: Diagnosis not present

## 2020-09-22 DIAGNOSIS — F1120 Opioid dependence, uncomplicated: Secondary | ICD-10-CM | POA: Diagnosis not present

## 2020-09-22 DIAGNOSIS — J9600 Acute respiratory failure, unspecified whether with hypoxia or hypercapnia: Secondary | ICD-10-CM | POA: Diagnosis not present

## 2020-09-22 DIAGNOSIS — C3490 Malignant neoplasm of unspecified part of unspecified bronchus or lung: Secondary | ICD-10-CM | POA: Diagnosis not present

## 2020-09-22 DIAGNOSIS — C4650 Kaposi's sarcoma of unspecified lung: Secondary | ICD-10-CM | POA: Diagnosis not present

## 2020-09-22 DIAGNOSIS — E43 Unspecified severe protein-calorie malnutrition: Secondary | ICD-10-CM | POA: Diagnosis not present

## 2020-09-22 DIAGNOSIS — J449 Chronic obstructive pulmonary disease, unspecified: Secondary | ICD-10-CM | POA: Diagnosis not present

## 2020-09-22 DIAGNOSIS — G8928 Other chronic postprocedural pain: Secondary | ICD-10-CM | POA: Diagnosis not present

## 2020-09-22 DIAGNOSIS — R262 Difficulty in walking, not elsewhere classified: Secondary | ICD-10-CM | POA: Diagnosis not present

## 2020-09-22 DIAGNOSIS — R5381 Other malaise: Secondary | ICD-10-CM | POA: Diagnosis not present

## 2020-09-22 DIAGNOSIS — Z9989 Dependence on other enabling machines and devices: Secondary | ICD-10-CM | POA: Diagnosis not present

## 2020-09-22 DIAGNOSIS — R000 Tachycardia, unspecified: Secondary | ICD-10-CM | POA: Diagnosis not present

## 2020-09-22 DIAGNOSIS — G894 Chronic pain syndrome: Secondary | ICD-10-CM | POA: Diagnosis not present

## 2020-09-22 DIAGNOSIS — M6281 Muscle weakness (generalized): Secondary | ICD-10-CM | POA: Diagnosis not present

## 2020-09-23 DIAGNOSIS — J9621 Acute and chronic respiratory failure with hypoxia: Secondary | ICD-10-CM | POA: Diagnosis not present

## 2020-09-23 DIAGNOSIS — Z9989 Dependence on other enabling machines and devices: Secondary | ICD-10-CM | POA: Diagnosis not present

## 2020-09-23 DIAGNOSIS — R000 Tachycardia, unspecified: Secondary | ICD-10-CM | POA: Diagnosis not present

## 2020-09-23 DIAGNOSIS — E43 Unspecified severe protein-calorie malnutrition: Secondary | ICD-10-CM | POA: Diagnosis not present

## 2020-09-23 DIAGNOSIS — C3490 Malignant neoplasm of unspecified part of unspecified bronchus or lung: Secondary | ICD-10-CM | POA: Diagnosis not present

## 2020-09-23 DIAGNOSIS — R5381 Other malaise: Secondary | ICD-10-CM | POA: Diagnosis not present

## 2020-09-23 DIAGNOSIS — J449 Chronic obstructive pulmonary disease, unspecified: Secondary | ICD-10-CM | POA: Diagnosis not present

## 2020-09-24 DIAGNOSIS — G894 Chronic pain syndrome: Secondary | ICD-10-CM | POA: Diagnosis not present

## 2020-09-24 DIAGNOSIS — J9621 Acute and chronic respiratory failure with hypoxia: Secondary | ICD-10-CM | POA: Diagnosis not present

## 2020-09-24 DIAGNOSIS — C3490 Malignant neoplasm of unspecified part of unspecified bronchus or lung: Secondary | ICD-10-CM | POA: Diagnosis not present

## 2020-09-24 DIAGNOSIS — C4650 Kaposi's sarcoma of unspecified lung: Secondary | ICD-10-CM | POA: Diagnosis not present

## 2020-09-24 DIAGNOSIS — G8929 Other chronic pain: Secondary | ICD-10-CM | POA: Diagnosis not present

## 2020-09-24 DIAGNOSIS — G8928 Other chronic postprocedural pain: Secondary | ICD-10-CM | POA: Diagnosis not present

## 2020-09-24 DIAGNOSIS — R5381 Other malaise: Secondary | ICD-10-CM | POA: Diagnosis not present

## 2020-09-24 DIAGNOSIS — C159 Malignant neoplasm of esophagus, unspecified: Secondary | ICD-10-CM | POA: Diagnosis not present

## 2020-09-24 DIAGNOSIS — R000 Tachycardia, unspecified: Secondary | ICD-10-CM | POA: Diagnosis not present

## 2020-09-24 DIAGNOSIS — Z9989 Dependence on other enabling machines and devices: Secondary | ICD-10-CM | POA: Diagnosis not present

## 2020-09-24 DIAGNOSIS — J449 Chronic obstructive pulmonary disease, unspecified: Secondary | ICD-10-CM | POA: Diagnosis not present

## 2020-09-24 DIAGNOSIS — E43 Unspecified severe protein-calorie malnutrition: Secondary | ICD-10-CM | POA: Diagnosis not present

## 2020-09-24 DIAGNOSIS — F1120 Opioid dependence, uncomplicated: Secondary | ICD-10-CM | POA: Diagnosis not present

## 2020-09-24 DIAGNOSIS — J9600 Acute respiratory failure, unspecified whether with hypoxia or hypercapnia: Secondary | ICD-10-CM | POA: Diagnosis not present

## 2020-09-25 DIAGNOSIS — Z9989 Dependence on other enabling machines and devices: Secondary | ICD-10-CM | POA: Diagnosis not present

## 2020-09-25 DIAGNOSIS — J9621 Acute and chronic respiratory failure with hypoxia: Secondary | ICD-10-CM | POA: Diagnosis not present

## 2020-09-25 DIAGNOSIS — J449 Chronic obstructive pulmonary disease, unspecified: Secondary | ICD-10-CM | POA: Diagnosis not present

## 2020-09-25 DIAGNOSIS — C3490 Malignant neoplasm of unspecified part of unspecified bronchus or lung: Secondary | ICD-10-CM | POA: Diagnosis not present

## 2020-09-26 DIAGNOSIS — J449 Chronic obstructive pulmonary disease, unspecified: Secondary | ICD-10-CM | POA: Diagnosis not present

## 2020-09-26 DIAGNOSIS — J9621 Acute and chronic respiratory failure with hypoxia: Secondary | ICD-10-CM | POA: Diagnosis not present

## 2020-09-26 DIAGNOSIS — Z9989 Dependence on other enabling machines and devices: Secondary | ICD-10-CM | POA: Diagnosis not present

## 2020-09-26 DIAGNOSIS — C3490 Malignant neoplasm of unspecified part of unspecified bronchus or lung: Secondary | ICD-10-CM | POA: Diagnosis not present

## 2020-09-27 DIAGNOSIS — R6521 Severe sepsis with septic shock: Secondary | ICD-10-CM

## 2020-09-27 DIAGNOSIS — G8929 Other chronic pain: Secondary | ICD-10-CM | POA: Diagnosis not present

## 2020-09-27 DIAGNOSIS — R5381 Other malaise: Secondary | ICD-10-CM | POA: Diagnosis not present

## 2020-09-27 DIAGNOSIS — J9600 Acute respiratory failure, unspecified whether with hypoxia or hypercapnia: Secondary | ICD-10-CM | POA: Diagnosis not present

## 2020-09-27 DIAGNOSIS — E43 Unspecified severe protein-calorie malnutrition: Secondary | ICD-10-CM | POA: Diagnosis not present

## 2020-09-27 DIAGNOSIS — C159 Malignant neoplasm of esophagus, unspecified: Secondary | ICD-10-CM | POA: Diagnosis not present

## 2020-09-27 DIAGNOSIS — R000 Tachycardia, unspecified: Secondary | ICD-10-CM | POA: Diagnosis not present

## 2020-09-27 DIAGNOSIS — G8928 Other chronic postprocedural pain: Secondary | ICD-10-CM | POA: Diagnosis not present

## 2020-09-27 DIAGNOSIS — J151 Pneumonia due to Pseudomonas: Secondary | ICD-10-CM

## 2020-09-27 DIAGNOSIS — G894 Chronic pain syndrome: Secondary | ICD-10-CM | POA: Diagnosis not present

## 2020-09-27 DIAGNOSIS — G9341 Metabolic encephalopathy: Secondary | ICD-10-CM

## 2020-09-27 DIAGNOSIS — C4650 Kaposi's sarcoma of unspecified lung: Secondary | ICD-10-CM | POA: Diagnosis not present

## 2020-09-27 DIAGNOSIS — J9621 Acute and chronic respiratory failure with hypoxia: Secondary | ICD-10-CM

## 2020-09-27 DIAGNOSIS — F1120 Opioid dependence, uncomplicated: Secondary | ICD-10-CM | POA: Diagnosis not present

## 2020-09-27 DIAGNOSIS — J449 Chronic obstructive pulmonary disease, unspecified: Secondary | ICD-10-CM | POA: Diagnosis not present

## 2020-09-28 DIAGNOSIS — J9621 Acute and chronic respiratory failure with hypoxia: Secondary | ICD-10-CM

## 2020-09-28 DIAGNOSIS — C159 Malignant neoplasm of esophagus, unspecified: Secondary | ICD-10-CM | POA: Diagnosis not present

## 2020-09-28 DIAGNOSIS — R5381 Other malaise: Secondary | ICD-10-CM | POA: Diagnosis not present

## 2020-09-28 DIAGNOSIS — G9341 Metabolic encephalopathy: Secondary | ICD-10-CM

## 2020-09-28 DIAGNOSIS — E43 Unspecified severe protein-calorie malnutrition: Secondary | ICD-10-CM | POA: Diagnosis not present

## 2020-09-28 DIAGNOSIS — Z930 Tracheostomy status: Secondary | ICD-10-CM | POA: Diagnosis not present

## 2020-09-28 DIAGNOSIS — J151 Pneumonia due to Pseudomonas: Secondary | ICD-10-CM

## 2020-09-28 DIAGNOSIS — R6521 Severe sepsis with septic shock: Secondary | ICD-10-CM

## 2020-09-28 DIAGNOSIS — J9601 Acute respiratory failure with hypoxia: Secondary | ICD-10-CM | POA: Diagnosis not present

## 2020-09-28 DIAGNOSIS — R000 Tachycardia, unspecified: Secondary | ICD-10-CM | POA: Diagnosis not present

## 2020-09-28 DIAGNOSIS — J9620 Acute and chronic respiratory failure, unspecified whether with hypoxia or hypercapnia: Secondary | ICD-10-CM | POA: Diagnosis not present

## 2020-09-29 DIAGNOSIS — G9341 Metabolic encephalopathy: Secondary | ICD-10-CM | POA: Diagnosis not present

## 2020-09-29 DIAGNOSIS — R5381 Other malaise: Secondary | ICD-10-CM | POA: Diagnosis not present

## 2020-09-29 DIAGNOSIS — J9621 Acute and chronic respiratory failure with hypoxia: Secondary | ICD-10-CM | POA: Diagnosis not present

## 2020-09-29 DIAGNOSIS — J9600 Acute respiratory failure, unspecified whether with hypoxia or hypercapnia: Secondary | ICD-10-CM | POA: Diagnosis not present

## 2020-09-29 DIAGNOSIS — C4650 Kaposi's sarcoma of unspecified lung: Secondary | ICD-10-CM | POA: Diagnosis not present

## 2020-09-29 DIAGNOSIS — G894 Chronic pain syndrome: Secondary | ICD-10-CM | POA: Diagnosis not present

## 2020-09-29 DIAGNOSIS — G8928 Other chronic postprocedural pain: Secondary | ICD-10-CM | POA: Diagnosis not present

## 2020-09-29 DIAGNOSIS — J151 Pneumonia due to Pseudomonas: Secondary | ICD-10-CM | POA: Diagnosis not present

## 2020-09-29 DIAGNOSIS — F1120 Opioid dependence, uncomplicated: Secondary | ICD-10-CM | POA: Diagnosis not present

## 2020-09-29 DIAGNOSIS — J449 Chronic obstructive pulmonary disease, unspecified: Secondary | ICD-10-CM | POA: Diagnosis not present

## 2020-09-29 DIAGNOSIS — C159 Malignant neoplasm of esophagus, unspecified: Secondary | ICD-10-CM | POA: Diagnosis not present

## 2020-09-29 DIAGNOSIS — G8929 Other chronic pain: Secondary | ICD-10-CM | POA: Diagnosis not present

## 2020-09-29 DIAGNOSIS — R000 Tachycardia, unspecified: Secondary | ICD-10-CM | POA: Diagnosis not present

## 2020-09-29 DIAGNOSIS — E43 Unspecified severe protein-calorie malnutrition: Secondary | ICD-10-CM | POA: Diagnosis not present

## 2020-09-29 DIAGNOSIS — R6521 Severe sepsis with septic shock: Secondary | ICD-10-CM | POA: Diagnosis not present

## 2020-10-01 DIAGNOSIS — C159 Malignant neoplasm of esophagus, unspecified: Secondary | ICD-10-CM | POA: Diagnosis not present

## 2020-10-01 DIAGNOSIS — J9620 Acute and chronic respiratory failure, unspecified whether with hypoxia or hypercapnia: Secondary | ICD-10-CM | POA: Diagnosis not present

## 2020-10-05 ENCOUNTER — Ambulatory Visit (INDEPENDENT_AMBULATORY_CARE_PROVIDER_SITE_OTHER): Payer: Medicare HMO | Admitting: Legal Medicine

## 2020-10-05 ENCOUNTER — Other Ambulatory Visit: Payer: Self-pay

## 2020-10-05 ENCOUNTER — Encounter: Payer: Self-pay | Admitting: Legal Medicine

## 2020-10-05 VITALS — BP 80/62 | HR 105 | Temp 98.1°F | Ht 74.0 in

## 2020-10-05 DIAGNOSIS — J151 Pneumonia due to Pseudomonas: Secondary | ICD-10-CM | POA: Diagnosis not present

## 2020-10-05 DIAGNOSIS — T8389XA Other specified complication of genitourinary prosthetic devices, implants and grafts, initial encounter: Secondary | ICD-10-CM

## 2020-10-05 DIAGNOSIS — L89154 Pressure ulcer of sacral region, stage 4: Secondary | ICD-10-CM

## 2020-10-05 DIAGNOSIS — C3412 Malignant neoplasm of upper lobe, left bronchus or lung: Secondary | ICD-10-CM | POA: Diagnosis not present

## 2020-10-05 DIAGNOSIS — L89301 Pressure ulcer of unspecified buttock, stage 1: Secondary | ICD-10-CM | POA: Diagnosis not present

## 2020-10-05 DIAGNOSIS — C7951 Secondary malignant neoplasm of bone: Secondary | ICD-10-CM | POA: Diagnosis not present

## 2020-10-05 DIAGNOSIS — J9611 Chronic respiratory failure with hypoxia: Secondary | ICD-10-CM | POA: Diagnosis not present

## 2020-10-05 DIAGNOSIS — L8994 Pressure ulcer of unspecified site, stage 4: Secondary | ICD-10-CM | POA: Insufficient documentation

## 2020-10-05 DIAGNOSIS — I10 Essential (primary) hypertension: Secondary | ICD-10-CM

## 2020-10-05 DIAGNOSIS — Z978 Presence of other specified devices: Secondary | ICD-10-CM

## 2020-10-05 DIAGNOSIS — Z930 Tracheostomy status: Secondary | ICD-10-CM

## 2020-10-05 NOTE — Progress Notes (Signed)
Established Patient Office Visit  Subjective:  Patient ID: [name] Shakespear., male    DOB: 1956/01/05  Age: 65 y.o. MRN: [id number]  CC:  Chief Complaint  Patient presents with   Transitions Of Care    HPI [name] [name]. presents fort transition of care  He was admitted to [place] [facility] 07/22/2020 for pseudomonas sepsis. And hypoxemia.  He was treated with IV antibiotics.  He was put in ICU on ventilator.  He then had has partial small bowel obstruction with  small bowel resection.  [facility]  He was  weaned off     ventilator and is on tracheostomy. 6 to 10 liter/minute.  He now has   feeding tube, he is NPO.  [employer] , qd.  No help at home.      We will call hospice.                 [facility] sent him home total care in wheelchair without any instruction to family or setting up home health care. 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/03/2011   COPD (chronic obstructive pulmonary disease) (HCC)    Esophageal cancer (HCC)    Hx of cervical spine surgery    Hypertension    Lung nodules    Obstructive sleep apnea    Personal history of malignant neoplasm of esophagus 08/18/2019   Pulmonary embolism [facility])     Past Surgical History:  Procedure Laterality Date   CERVICAL SPINE SURGERY     EUS  08/03/2011   Procedure: UPPER ENDOSCOPIC ULTRASOUND (EUS) LINEAR;  Surgeon: Rachael Fee, MD;  Location: [facility];  Service: Endoscopy;  Laterality: N/A;   IVC FILTER PLACEMENT ([facility] HX)  04/03/2016   small clot in right lung  04/10/2013   stomach pull through surgery 2013  2013       TONSILLECTOMY      Family History  Problem Relation Age of Onset   Hypertension Father    COPD Father    CAD Father    Pancreatic cancer Sister    Hypertension Mother     Social History   Socioeconomic History   Marital status: Married    Spouse name: Not on file   Number  of children: Not on file   Years of education: Not on file   Highest education level: Not on file  Occupational History   Not on file  Tobacco Use   Smoking status: Former    Packs/day: 1.00    Years: 42.00    Pack years: 42.00    Types: Cigarettes    Quit date: 03/04/2014    Years since quitting: 6.5   Smokeless tobacco: Never  Vaping Use   Vaping Use: Unknown  Substance and Sexual Activity   Alcohol use: No   Drug use: Not Currently    Types: Marijuana   Sexual activity: Yes    Partners: Female    Birth control/protection: None  Other Topics Concern   Not on file  Social History Narrative   Lives with spouse   Social Determinants of Health   Financial Resource Strain: Not on file  Food Insecurity: Not on file  Transportation Needs: Not on file  Physical Activity: Not on file  Stress: Not on file  Social Connections: Not on file  Intimate Partner Violence: Not on file  Outpatient Medications Prior to Visit  Medication Sig Dispense Refill   clonazePAM (KLONOPIN PO) Take by mouth.     LORazepam (ATIVAN) 1 MG tablet      omeprazole (PRILOSEC) 40 MG capsule Take 40 mg by mouth daily.     OXYCODONE ER PO Take by mouth as needed.     TRELEGY ELLIPTA 100-62.5-25 MCG/INH AEPB      ALPRAZolam (XANAX) 0.25 MG tablet Take 1 tablet (0.25 mg total) by mouth at bedtime. (Patient not taking: Reported on 10/05/2020) 30 tablet 2   Hydroactive Dressings (DUODERM HYDROACTIVE) PSTE Apply 1 application topically 2 (two) times a week. (Patient not taking: Reported on 10/05/2020) 30 g 3   ipratropium-albuterol (DUONEB) 0.5-2.5 (3) MG/3ML SOLN Take 3 mLs by nebulization 4 (four) times daily. (Patient not taking: Reported on 10/05/2020)     ipratropium-albuterol (DUONEB) 0.5-2.5 (3) MG/3ML SOLN  (Patient not taking: Reported on 10/05/2020)     No facility-administered medications prior to visit.    Allergies  Allergen Reactions   Warfarin And Related Other (See Comments)    And other blood  thinners d/t bleeding in the brain    ROS Review of Systems  Constitutional:  Negative for chills, fatigue and fever.  HENT:  Negative for congestion, ear pain and sore throat.   Respiratory:  Negative for cough and shortness of breath.   Cardiovascular:  Negative for chest pain.  Gastrointestinal:  Negative for abdominal pain, constipation, diarrhea, nausea and vomiting.  Endocrine: Negative for polydipsia, polyphagia and polyuria.  Genitourinary:  Negative for dysuria and frequency.  Musculoskeletal:  Negative for arthralgias and myalgias.  Neurological:  Negative for dizziness and headaches.  Psychiatric/Behavioral:  Negative for dysphoric mood.        No dysphoria     Objective:    Physical Exam Vitals reviewed.  Constitutional:      Appearance: Normal appearance.  HENT:     Right Ear: Tympanic membrane normal.     Left Ear: Tympanic membrane normal.     Mouth/Throat:     Mouth: Mucous membranes are dry.     Comments: Tracheostomy clean, no infection Eyes:     Extraocular Movements: Extraocular movements intact.     Conjunctiva/sclera: Conjunctivae normal.     Pupils: Pupils are equal, round, and reactive to light.  Cardiovascular:     Rate and Rhythm: Normal rate and regular rhythm.     Pulses: Normal pulses.     Heart sounds: Normal heart sounds. No murmur heard.   No gallop.  Pulmonary:     Effort: Pulmonary effort is normal. No respiratory distress.     Breath sounds: Normal breath sounds. No wheezing.  Abdominal:     General: Abdomen is flat. Bowel sounds are normal. There is no distension.     Palpations: Abdomen is soft.     Tenderness: There is no abdominal tenderness.  Musculoskeletal:     Cervical back: Normal range of motion and neck supple.  Skin:    General: Skin is warm.     Capillary Refill: Capillary refill takes less than 2 seconds.     Comments: 4 by 10 cm stage 4  on buttock.  Neurological:     General: No focal deficit present.     Mental  Status: He is alert and oriented to person, place, and time. Mental status is at baseline.  Psychiatric:        Mood and Affect: Mood normal.  Thought Content: Thought content normal.    BP (!) 80/62   Pulse (!) 105   Temp 98.1 F (36.7 C)   Ht 6\' 2"  (1.88 m)   SpO2 90% Comment: has trach  BMI 18.36 kg/m  Wt Readings from Last 3 Encounters:  07/21/20 143 lb (64.9 kg)  06/30/20 144 lb (65.3 kg)  06/16/20 147 lb 14.4 oz (67.1 kg)     Health Maintenance Due  Topic Date Due   HIV Screening  Never done   Hepatitis C Screening  Never done   TETANUS/TDAP  Never done   Zoster Vaccines- Shingrix (1 of 2) Never done   Pneumococcal Vaccine 58-23 Years old (2 - PCV) 01/25/2013   INFLUENZA VACCINE  09/27/2020    There are no preventive care reminders to display for this patient.  Lab Results  Component Value Date   TSH 2.440 10/28/2019   Lab Results  Component Value Date   WBC 8.3 03/10/2020   HGB 11.6 (L) 03/10/2020   HCT 35.4 (L) 03/10/2020   MCV 87 03/10/2020   PLT 243 03/10/2020   Lab Results  Component Value Date   NA 146 (H) 03/10/2020   K 4.5 03/10/2020   CO2 29 03/10/2020   GLUCOSE 121 (H) 03/10/2020   BUN 10 03/10/2020   CREATININE 0.99 03/10/2020   BILITOT <0.2 03/10/2020   ALKPHOS 93 03/10/2020   AST 14 03/10/2020   ALT 10 03/10/2020   PROT 7.4 03/10/2020   ALBUMIN 3.9 03/10/2020   CALCIUM 9.6 03/10/2020   No results found for: CHOL No results found for: HDL No results found for: LDLCALC No results found for: TRIG No results found for: CHOLHDL Lab Results  Component Value Date   HGBA1C 5.4 03/10/2020      Assessment & Plan:  Diagnoses and all orders for this visit: Primary hypertension An individual hypertension care plan was established and reinforced today.  The patient's status was assessed using clinical findings on exam and labs or diagnostic tests. The patient's success at meeting treatment goals on disease specific evidence-based  guidelines and found to be well controlled. SELF MANAGEMENT: The patient and I together assessed ways to personally work towards obtaining the recommended goals. RECOMMENDATIONS: avoid decongestants found in common cold remedies, decrease consumption of alcohol, perform routine monitoring of BP with home BP cuff, exercise, reduction of dietary salt, take medicines as prescribed, try not to miss doses and quit smoking.  Regular exercise and maintaining a healthy weight is needed.  Stress reduction may help. A CLINICAL SUMMARY including written plan identify barriers to care unique to individual due to social or financial issues.  We attempt to mutually creat solutions for individual and family understanding.   Pressure injury of sacral region, stage 4 (HCC) Large stage 4 ulcer to sacrum- start hospice care fr this  Malignant neoplasm of upper lobe of left lung [facility]) Patient has diagnosis of LUL neoplasm with rib metastases, he is not to get radiation or chemotherapy, he has lost incredible weight   Tracheostomy status [facility]) Patient continues to have tracheostomy tube but no respiratory care has been arranged, hospice will heap.   Metastasis to bone [facility]) Patient has mets to ribs was treated with radiation in past.  He is on oxycodone through J-tube.  Uses feeding tube Patient is on feeding tube 1000cc a day by continuous feed, we will need to increase to 1200 to 1500cc if possible.  Pneumonia due to Pseudomonas species,  unspecified laterality, unspecified part  of lung [facility]) Patient was  originally admitted to [facility] for pseudomonas sepsis and pneumonia  Chronic respiratory failure with hypoxia (HCC) Pseudomonas pneumonia led to respiratory failure with need for intubation and patient was in ICU.  Indwelling Foley catheter calcification, initial encounter [facility])  He remains with foley catheter, we will consult with urology about discontinuing    1 hour visit, review of  records, discussion of chronic and Hospice care, family conference on care and prognosis   Follow-up: Return if symptoms worsen or fail to improve, for hospice will care, wife spoke to them.    Brent Bulla, MD

## 2020-10-11 ENCOUNTER — Telehealth: Payer: Self-pay | Admitting: Oncology

## 2020-10-11 NOTE — Telephone Encounter (Signed)
8/15/22spoke with wife(Mary) and all appts cancelled-Pt going into hospice care.

## 2020-10-12 DIAGNOSIS — J449 Chronic obstructive pulmonary disease, unspecified: Secondary | ICD-10-CM | POA: Diagnosis not present

## 2020-10-12 DIAGNOSIS — J9611 Chronic respiratory failure with hypoxia: Secondary | ICD-10-CM | POA: Diagnosis not present

## 2020-10-15 ENCOUNTER — Inpatient Hospital Stay: Payer: Medicare HMO | Admitting: Oncology

## 2020-11-15 ENCOUNTER — Encounter: Payer: Medicare HMO | Admitting: Nurse Practitioner

## 2020-11-27 DEATH — deceased
